# Patient Record
Sex: Male | Born: 1970 | Race: White | Hispanic: No | Marital: Married | Smoking: Never smoker
Health system: Southern US, Community
[De-identification: ages and names within clinical notes are randomized; demographics above are authoritative.]

## PROBLEM LIST (undated history)

## (undated) DIAGNOSIS — F909 Attention-deficit hyperactivity disorder, unspecified type: Secondary | ICD-10-CM

## (undated) DIAGNOSIS — J45909 Unspecified asthma, uncomplicated: Secondary | ICD-10-CM

## (undated) HISTORY — DX: Unspecified asthma, uncomplicated: J45.909

## (undated) HISTORY — DX: Attention-deficit hyperactivity disorder, unspecified type: F90.9

---

## 2012-01-18 DIAGNOSIS — J45909 Unspecified asthma, uncomplicated: Secondary | ICD-10-CM | POA: Insufficient documentation

## 2018-07-07 ENCOUNTER — Encounter: Payer: Self-pay | Admitting: Family Medicine

## 2018-07-07 ENCOUNTER — Ambulatory Visit (INDEPENDENT_AMBULATORY_CARE_PROVIDER_SITE_OTHER): Payer: Managed Care, Other (non HMO) | Admitting: Family Medicine

## 2018-07-07 VITALS — BP 133/95 | HR 76 | Ht 72.0 in | Wt 226.0 lb

## 2018-07-07 DIAGNOSIS — Z6825 Body mass index (BMI) 25.0-25.9, adult: Secondary | ICD-10-CM | POA: Insufficient documentation

## 2018-07-07 DIAGNOSIS — F902 Attention-deficit hyperactivity disorder, combined type: Secondary | ICD-10-CM

## 2018-07-07 DIAGNOSIS — Z23 Encounter for immunization: Secondary | ICD-10-CM

## 2018-07-07 DIAGNOSIS — Z6830 Body mass index (BMI) 30.0-30.9, adult: Secondary | ICD-10-CM

## 2018-07-07 DIAGNOSIS — F909 Attention-deficit hyperactivity disorder, unspecified type: Secondary | ICD-10-CM

## 2018-07-07 DIAGNOSIS — Z7184 Encounter for health counseling related to travel: Secondary | ICD-10-CM

## 2018-07-07 DIAGNOSIS — J4540 Moderate persistent asthma, uncomplicated: Secondary | ICD-10-CM

## 2018-07-07 DIAGNOSIS — Z7189 Other specified counseling: Secondary | ICD-10-CM | POA: Diagnosis not present

## 2018-07-07 HISTORY — DX: Attention-deficit hyperactivity disorder, unspecified type: F90.9

## 2018-07-07 LAB — LIPID PANEL W/REFLEX DIRECT LDL
CHOL/HDL RATIO: 3.4 (calc) (ref <5.0)
Cholesterol: 178 mg/dL (ref <200)
HDL: 53 mg/dL (ref >40)
LDL CHOLESTEROL (CALC): 111 mg/dL — AB
NON-HDL CHOLESTEROL (CALC): 125 mg/dL (ref <130)
TRIGLYCERIDES: 57 mg/dL (ref <150)

## 2018-07-07 LAB — CBC
HEMATOCRIT: 47 % (ref 38.5–50.0)
HEMOGLOBIN: 15.9 g/dL (ref 13.2–17.1)
MCH: 29.2 pg (ref 27.0–33.0)
MCHC: 33.8 g/dL (ref 32.0–36.0)
MCV: 86.4 fL (ref 80.0–100.0)
MPV: 10 fL (ref 7.5–12.5)
Platelets: 241 10*3/uL (ref 140–400)
RBC: 5.44 10*6/uL (ref 4.20–5.80)
RDW: 13.2 % (ref 11.0–15.0)
WBC: 9.1 10*3/uL (ref 3.8–10.8)

## 2018-07-07 LAB — COMPLETE METABOLIC PANEL WITH GFR
AG RATIO: 2 (calc) (ref 1.0–2.5)
ALT: 26 U/L (ref 9–46)
AST: 25 U/L (ref 10–40)
Albumin: 4.8 g/dL (ref 3.6–5.1)
Alkaline phosphatase (APISO): 75 U/L (ref 40–115)
BUN: 16 mg/dL (ref 7–25)
CALCIUM: 9.6 mg/dL (ref 8.6–10.3)
CO2: 28 mmol/L (ref 20–32)
CREATININE: 1.03 mg/dL (ref 0.60–1.35)
Chloride: 102 mmol/L (ref 98–110)
GFR, EST AFRICAN AMERICAN: 102 mL/min/{1.73_m2} (ref >=60)
GFR, EST NON AFRICAN AMERICAN: 88 mL/min/{1.73_m2} (ref >=60)
GLUCOSE: 88 mg/dL (ref 65–99)
Globulin: 2.4 g/dL (calc) (ref 1.9–3.7)
Potassium: 5 mmol/L (ref 3.5–5.3)
Sodium: 139 mmol/L (ref 135–146)
TOTAL PROTEIN: 7.2 g/dL (ref 6.1–8.1)
Total Bilirubin: 0.9 mg/dL (ref 0.2–1.2)

## 2018-07-07 MED ORDER — IPRATROPIUM-ALBUTEROL 0.5-2.5 (3) MG/3ML IN SOLN: 3.0000 mL | Freq: Four times a day (QID) | RESPIRATORY_TRACT | 0 refills | Status: AC | PRN

## 2018-07-07 MED ORDER — FLUTICASONE FUROATE-VILANTEROL 200-25 MCG/INH IN AEPB: 1.0000 | INHALATION_SPRAY | Freq: Every day | RESPIRATORY_TRACT | 12 refills | Status: DC

## 2018-07-07 MED ORDER — ALBUTEROL SULFATE HFA 108 (90 BASE) MCG/ACT IN AERS: 2.0000 | INHALATION_SPRAY | Freq: Four times a day (QID) | RESPIRATORY_TRACT | 2 refills | Status: DC | PRN

## 2018-07-07 MED ORDER — AMPHETAMINE-DEXTROAMPHETAMINE 10 MG PO TABS: 5.0000 mg | ORAL_TABLET | Freq: Three times a day (TID) | ORAL | 0 refills | Status: DC

## 2018-07-07 MED ORDER — ATOVAQUONE-PROGUANIL HCL 250-100 MG PO TABS: 1.0000 | ORAL_TABLET | Freq: Every day | ORAL | 3 refills | Status: DC

## 2018-07-07 NOTE — Progress Notes (Signed)
Joel Best is a 47 y.o. male who [facility] today for establish care discuss asthma ADHD and travel.  Joel had has a history of moderate asthma.  Typically this is quite well controlled with Breo.  Has been out of Breo recently have been uses albuterol more frequently.  He needs refills of Breo albuterol inhaler and DuoNeb for his nebulizer.  He does not use his nebulizer hardly at all but likes to have it available if needed.  Additionally Joel has a history of ADHD.  This was diagnosed by Dr. Dorothyann GibbsFinch in 2014.  Unfortunately his medical office is closed.  He previously was taking Adderall 5 mg 3 times daily which worked well.  He notes the medication allows him to concentrate and complete tasks at work.   Joel also travels to [place] for work frequently at [employer].  He typically uses Malarone and prophylactically for malaria.  He is not sure when he is traveling again but often has to leave short notice and would like to have a prescription available in case he needs it.  ROS as above: No headache, visual changes, nausea, vomiting, diarrhea, constipation, dizziness, abdominal pain, skin rash, fevers, chills, night sweats, weight loss, swollen lymph nodes, body aches, joint swelling, muscle aches, chest pain, shortness of breath, mood changes, visual or auditory hallucinations.    Exam:  BP (!) 133/95   Pulse 76   Ht 6' (1.829 m)   Wt 226 lb (102.5 kg)   BMI 30.65 kg/m  Gen: Well NAD HEENT: EOMI,  MMM Lungs: Normal work of breathing. CTABL Heart: RRR no MRG Abd: NABS, Soft. Nondistended, Nontender Exts: Brisk capillary refill, warm and well perfused.    Lab and Radiology Results Records reviewed. Controlled substance database reviewed confirming Adderall prescription history.   Assessment and Plan: 47 y.o. male with  Asthma: Moderate intermittent.   Resume Breo.  Use Adderall as needed.  Careful use with DuoNeb as needed.  ADHD: Resume Adderall.  Medical records likely not available as the medical practice has closed permanently.  Recheck 3 months.  Travel: Malarone prescribed.  Blood pressure bit elevated today.  Typically better controlled.  Recheck in 3 months.  Tdap given today prior to discharge.  Basic fasting labs obtained.  Orders Placed This Encounter  Procedures  . Tdap vaccine greater than or equal to 7yo IM  . CBC  . COMPLETE METABOLIC PANEL WITH GFR  . Lipid Panel w/reflex Direct LDL   Meds ordered this encounter  Medications  . fluticasone furoate-vilanterol (BREO ELLIPTA) 200-25 MCG/INH AEPB    Sig: Inhale 1 puff into the lungs daily.    Dispense:  1 each    Refill:  12  . albuterol (VENTOLIN HFA) 108 (90 Base) MCG/ACT inhaler    Sig: Inhale 2 puffs into the lungs every 6 (six) hours as needed for wheezing or shortness of breath.    Dispense:  1 Inhaler    Refill:  2    Ok to prescribe proair or ventolin  . ipratropium-albuterol (DUONEB) 0.5-2.5 (3) MG/3ML SOLN    Sig: Inhale 3 mLs into the lungs every 6 (six) hours as needed.    Dispense:  360 mL    Refill:  0  . atovaquone-proguanil (MALARONE) 250-100 MG TABS tablet    Sig: Take 1 tablet by mouth daily.    Dispense:  90 tablet    Refill:  3  . amphetamine-dextroamphetamine (  ADDERALL) 10 MG tablet    Sig: Take 0.5 tablets (5 mg total) by mouth 3 (three) times daily.    Dispense:  135 tablet    Refill:  0     Historical information moved to improve visibility of documentation.  Past Medical History:  Diagnosis Date  . ADHD 07/07/2018  . Asthma    History reviewed. No pertinent surgical history. Social History   Tobacco Use  . Smoking status: Never Smoker  . Smokeless tobacco: Never Used  Substance Use Topics  . Alcohol use: Not on file   family history includes Diabetes in his maternal grandmother; Hypertension in his maternal  grandmother.  Medications: Current Outpatient Medications  Medication Sig Dispense Refill  . albuterol (VENTOLIN HFA) 108 (90 Base) MCG/ACT inhaler Inhale 2 puffs into the lungs every 6 (six) hours as needed for wheezing or shortness of breath. 1 Inhaler 2  . atovaquone-proguanil (MALARONE) 250-100 MG TABS tablet Take 1 tablet by mouth daily. 90 tablet 3  . cetirizine (ZYRTEC) 10 MG tablet Take 10 mg by mouth daily.    . fluticasone furoate-vilanterol (BREO ELLIPTA) 200-25 MCG/INH AEPB Inhale 1 puff into the lungs daily. 1 each 12  . ipratropium-albuterol (DUONEB) 0.5-2.5 (3) MG/3ML SOLN Inhale 3 mLs into the lungs every 6 (six) hours as needed. 360 mL 0  . amphetamine-dextroamphetamine (ADDERALL) 10 MG tablet Take 0.5 tablets (5 mg total) by mouth 3 (three) times daily. 135 tablet 0   No current facility-administered medications for this visit.    No Known Allergies   Discussed warning signs or symptoms. Please see discharge instructions. Patient expresses understanding.

## 2018-07-07 NOTE — Patient Instructions (Addendum)
Thank you for coming in today. Continue current medicine.  Recheck in 3 months for ADHD.  Return sooner if needed.   Recommend Flu vaccine this fall.    Send me records when you can.  Return sooner if needed.

## 2018-08-17 ENCOUNTER — Emergency Department (INDEPENDENT_AMBULATORY_CARE_PROVIDER_SITE_OTHER): Payer: Managed Care, Other (non HMO)

## 2018-08-17 ENCOUNTER — Encounter: Payer: Self-pay | Admitting: Emergency Medicine

## 2018-08-17 ENCOUNTER — Emergency Department (INDEPENDENT_AMBULATORY_CARE_PROVIDER_SITE_OTHER)
Admission: EM | Admit: 2018-08-17 | Discharge: 2018-08-17 | Disposition: A | Discharge status: Home / Self Care | Payer: Managed Care, Other (non HMO) | Source: Home / Self Care | Attending: Family Medicine | Admitting: Family Medicine

## 2018-08-17 DIAGNOSIS — S8391XA Sprain of unspecified site of right knee, initial encounter: Secondary | ICD-10-CM

## 2018-08-17 DIAGNOSIS — S76011A Strain of muscle, fascia and tendon of right hip, initial encounter: Secondary | ICD-10-CM

## 2018-08-17 DIAGNOSIS — S80211A Abrasion, right knee, initial encounter: Secondary | ICD-10-CM | POA: Diagnosis not present

## 2018-08-17 DIAGNOSIS — M25561 Pain in right knee: Secondary | ICD-10-CM | POA: Diagnosis not present

## 2018-08-17 MED ORDER — MUPIROCIN 2 % EX OINT: 1.0000 "application " | TOPICAL_OINTMENT | Freq: Three times a day (TID) | CUTANEOUS | 0 refills | Status: DC

## 2018-08-17 NOTE — Discharge Instructions (Signed)
Apply ice pack for 30 minutes 2 to 3 times daily until swelling decreases. Wear Ace wrap until swelling decreases.  Wear brace for about 10 to 14 days.  Begin range of motion and stretching exercises in about 5 days as per instruction sheet.  May take Ibuprofen 200mg , 4 tabs every 8 hours with food.

## 2018-08-17 NOTE — ED Triage Notes (Signed)
Pt c/o right knee after playing baseball yesterday. States pain is constant and worse with movement. Ibuprofen and ice do not relieve pain.

## 2018-08-17 NOTE — ED Provider Notes (Signed)
[facility]    CSN: [id number] Arrival date & time: 08/17/18  0936     History   Chief Complaint Chief Complaint  Patient presents with  . Knee Pain    HPI Joel Best is a 47 y.o. male.   While playing baseball yesterday, patient slid into base and scraped/twisted his right knee.  He has had persistent swelling and pain with weight bearing.  He also reports mild pain in his right upper anterior leg with hip flexion.  The history is provided by the patient.  Knee Pain  Location:  Knee Time since incident:  1 day Injury: yes   Mechanism of injury: fall   Fall:    Fall occurred:  Running   Impact surface:  Dirt Knee location:  R knee Pain details:    Quality:  Aching   Radiates to:  Does not radiate   Severity:  Moderate   Onset quality:  Sudden   Duration:  1 day   Timing:  Constant   Progression:  Worsening Chronicity:  New Dislocation: no   Prior injury to area:  No Relieved by:  Nothing Worsened by:  Bearing weight and extension Ineffective treatments:  NSAIDs Associated symptoms: decreased ROM, stiffness and swelling   Associated symptoms: no muscle weakness, no numbness and no tingling     Past Medical History:  Diagnosis Date  . ADHD 07/07/2018  . Asthma     Patient Active Problem List   Diagnosis Date Noted  . BMI 30.0-30.9,adult 07/07/2018  . ADHD 07/07/2018  . Asthma 01/18/2012    History reviewed. No pertinent surgical history.     Home Medications    Prior to Admission medications   Medication Sig Start Date End Date Taking? Authorizing Provider  albuterol (VENTOLIN HFA) 108 (90 Base) MCG/ACT inhaler Inhale 2 puffs into the lungs every 6 (six) hours as needed for wheezing or shortness of breath. 07/07/18   Rodolph Bong, MD  amphetamine-dextroamphetamine (ADDERALL) 10 MG tablet Take 0.5 tablets (5 mg total) by mouth 3 (three) times daily. 07/07/18 10/05/18  Rodolph Bong, MD  atovaquone-proguanil (MALARONE) 250-100 MG TABS  tablet Take 1 tablet by mouth daily. 07/07/18   Rodolph Bong, MD  cetirizine (ZYRTEC) 10 MG tablet Take 10 mg by mouth daily.    [provider]  fluticasone furoate-vilanterol (BREO ELLIPTA) 200-25 MCG/INH AEPB Inhale 1 puff into the lungs daily. 07/07/18   Rodolph Bong, MD  ipratropium-albuterol (DUONEB) 0.5-2.5 (3) MG/3ML SOLN Inhale 3 mLs into the lungs every 6 (six) hours as needed. 07/07/18   Rodolph Bong, MD  mupirocin ointment (BACTROBAN) 2 % Apply 1 application topically 3 (three) times daily. 08/17/18   Lattie Haw, MD    Family History Family History  Problem Relation Age of Onset  . Diabetes Maternal Grandmother   . Hypertension Maternal Grandmother     Social History Social History   Tobacco Use  . Smoking status: Never Smoker  . Smokeless tobacco: Never Used  Substance Use Topics  . Alcohol use: Not on file  . Drug use: Not on file     Allergies   Patient has no known allergies.   Review of Systems Review of Systems  Musculoskeletal: Positive for stiffness.  All other systems reviewed and are negative.    Physical Exam Triage Vital Signs ED Triage Vitals [08/17/18 1042]  Enc Vitals Group     BP 133/84     Pulse Rate 60  Resp      Temp 98.9 F (37.2 C)     Temp Source Oral     SpO2 99 %     Weight 225 lb (102.1 kg)     Height      Head Circumference      Peak Flow      Pain Score 6     Pain Loc      Pain Edu?      Excl. in GC?    No data found.  Updated Vital Signs BP 133/84 (BP Location: Right Arm)   Pulse 60   Temp 98.9 F (37.2 C) (Oral)   Wt 102.1 kg   SpO2 99%   BMI 30.52 kg/m   Visual Acuity Right Eye Distance:   Left Eye Distance:   Bilateral Distance:    Right Eye Near:   Left Eye Near:    Bilateral Near:     Physical Exam  Constitutional: He appears well-developed and well-nourished. No distress.  HENT:  Head: Atraumatic.  Eyes: Pupils are equal, round, and reactive to light.  Cardiovascular:  Normal rate.  Pulmonary/Chest: Effort normal.  Musculoskeletal:       Right knee: He exhibits decreased range of motion and swelling. He exhibits no ecchymosis, no deformity, no laceration, no erythema, normal alignment, no LCL laxity, normal patellar mobility, normal meniscus and no MCL laxity. Tenderness found.       Right upper leg: He exhibits tenderness.       Legs: Right knee:  Swelling anteriorly.  No effusion, erythema, or warmth.  Knee stable, negative drawer test.  McMurray test negative.  Mild tenderness over patella.  Decreased range of motion; has difficulty flexing more than 90 degrees, and difficulty achieving full extension.  There is mild tenderness over quadriceps muscle; pain elicited with right hip flexion.  Neurological: He is alert.  Skin: Skin is warm and dry.  Nursing note and vitals reviewed.    UC Treatments / Results  Labs (all labs ordered are listed, but only abnormal results are displayed) Labs Reviewed - No data to display  EKG None  Radiology Dg Knee Complete 4 Views Right  Result Date: 08/17/2018 CLINICAL DATA:  Right knee pain after baseball injury last night EXAM: RIGHT KNEE - COMPLETE 4+ VIEW COMPARISON:  None. FINDINGS: No evidence of fracture, dislocation, or joint effusion. No evidence of arthropathy or other focal bone abnormality. Soft tissues are unremarkable. IMPRESSION: Normal right knee. Electronically Signed   By: Lupita Raider, M.D.   On: 08/17/2018 11:37    Procedures Procedures (including critical care time)  Medications Ordered in UC Medications - No data to display  Initial Impression / Assessment and Plan / UC Course  I have reviewed the triage vital signs and the nursing notes.  Pertinent labs & imaging results that were available during my care of the patient were reviewed by me and considered in my medical decision making (see chart for details).    Applied Bacitracin ointment to abrasion with bandage.  Rx given for  Mupirocin ointment. Applied ace wrap.  Dispensed hinged knee brace. Followup with Dr. Rodney Langton or Dr. Clementeen Graham ([facility]) if not improving about two weeks.    Final Clinical Impressions(s) / UC Diagnoses   Final diagnoses:  Sprain of right knee, unspecified ligament, initial encounter  Abrasion of right knee, initial encounter  Strain of hip flexor, right, initial encounter     Discharge Instructions     Apply  ice pack for 30 minutes 2 to 3 times daily until swelling decreases. Wear Ace wrap until swelling decreases.  Wear brace for about 10 to 14 days.  Begin range of motion and stretching exercises in about 5 days as per instruction sheet.  May take Ibuprofen 200mg , 4 tabs every 8 hours with food.      ED Prescriptions    Medication Sig Dispense Auth. Provider   mupirocin ointment (BACTROBAN) 2 % Apply 1 application topically 3 (three) times daily. 15 g Lattie Haw, MD         Lattie Haw, MD 08/17/18 [phone]

## 2018-10-21 ENCOUNTER — Ambulatory Visit (INDEPENDENT_AMBULATORY_CARE_PROVIDER_SITE_OTHER): Payer: Managed Care, Other (non HMO) | Admitting: Family Medicine

## 2018-10-21 ENCOUNTER — Encounter: Payer: Self-pay | Admitting: Family Medicine

## 2018-10-21 VITALS — BP 151/87 | HR 70 | Ht 72.0 in | Wt 235.0 lb

## 2018-10-21 DIAGNOSIS — J4540 Moderate persistent asthma, uncomplicated: Secondary | ICD-10-CM | POA: Diagnosis not present

## 2018-10-21 DIAGNOSIS — M7041 Prepatellar bursitis, right knee: Secondary | ICD-10-CM | POA: Insufficient documentation

## 2018-10-21 DIAGNOSIS — I10 Essential (primary) hypertension: Secondary | ICD-10-CM | POA: Diagnosis not present

## 2018-10-21 DIAGNOSIS — F902 Attention-deficit hyperactivity disorder, combined type: Secondary | ICD-10-CM | POA: Diagnosis not present

## 2018-10-21 MED ORDER — VENTOLIN HFA 108 (90 BASE) MCG/ACT IN AERS: 1.0000 | INHALATION_SPRAY | Freq: Four times a day (QID) | RESPIRATORY_TRACT | 2 refills | Status: DC | PRN

## 2018-10-21 MED ORDER — DICLOFENAC SODIUM 1 % TD GEL: 4.0000 g | Freq: Four times a day (QID) | TRANSDERMAL | 11 refills | Status: AC

## 2018-10-21 MED ORDER — AMPHETAMINE-DEXTROAMPHETAMINE 10 MG PO TABS: 10.0000 mg | ORAL_TABLET | Freq: Three times a day (TID) | ORAL | 0 refills | Status: DC

## 2018-10-21 NOTE — Progress Notes (Signed)
Joel Best is a 47 y.o. male who [facility]: Primary Care [facility] Medicine today for right knee pain, follow-up ADHD, asthma, and hypertension.  Right knee pain: Joel injured his right knee about 2 months ago.  He was seen in urgent care and had relatively normal x-rays.  He notes anterior knee pain and swelling worse with full knee flexion.  He notes pain with activity kneeling better with rest.  Pain is slowly improving but still present.  He is able to exercise and work.  He is tried over-the-counter medicines which have helped a bit.  Additionally he has been taking Adderall for ADHD.  He notes he was mistaken on the initial dose states that he actually was supposed to be taking 10 mg instead of 5.  He knows it works quite well and is happy with how things are going he denies of noxious side effects.  Elevated blood pressure: Joel has had elevated blood pressure measurements previously.  No chest pain palpitations shortness of breath lightheadedness or dizziness.  No previous diagnosis of hypertension.  Asthma: Joel is doing well.  He uses Breo daily.  He typically does not use albuterol.  He prefers the Ventolin brand compared to the [employer] Air and would like a prescription for Ventolin brand and albuterol if possible.   ROS as above:  Exam:  BP (!) 151/87   Pulse 70   Ht 6' (1.829 m)   Wt 235 lb (106.6 kg)   BMI 31.87 kg/m   Vitals:   10/21/18 0836 10/21/18 0902  BP: (!) 143/98 (!) 151/87  Pulse: 67 70  Weight: 235 lb (106.6 kg)   Height: 6' (1.829 m)     Wt Readings from Last 5 Encounters:  10/21/18 235 lb (106.6 kg)  08/17/18 225 lb (102.1 kg)  07/07/18 226 lb (102.5 kg)    Gen: Well NAD HEENT: EOMI,  MMM Lungs: Normal work of breathing. CTABL Heart: RRR no MRG Abd: NABS, Soft. Nondistended, Nontender Exts: Brisk capillary refill, warm and well perfused. Right knee:  Slightly swollen at the anterior aspect of the knee otherwise normal-appearing Age of motion 0-120 degrees. Tender palpation anterior patella and patellar tendon otherwise nontender. Stable ligamentous exam. Positive medial McMurray's test. Intact flexion and extension strength.  Lab and Radiology Results EXAM: RIGHT KNEE - COMPLETE 4+ VIEW  COMPARISON:  None.  FINDINGS: No evidence of fracture, dislocation, or joint effusion. No evidence of arthropathy or other focal bone abnormality. Soft tissues are unremarkable.  IMPRESSION: Normal right knee.   Electronically Signed   By: Lupita RaiderJames  Green Jr, M.D.   On: 08/17/2018 11:37 .I personally (independently) visualized and performed the interpretation of the images attached in this note.    Assessment and Plan: 47 y.o. male with  Right anterior knee pain likely prepatellar bursitis.  Possible internal derangement including medial meniscus injury.  X-ray was unremarkable.  Plan for diclofenac gel and compressive knee sleeve recheck in 3 months or sooner if needed.  Hypertension: Blood pressure elevated today.  Plan for home measurement recheck 3 months likely will start medication at next check.  ADHD doing well increase to 10 mg 3 times daily.  Recheck 3 months.  Asthma doing well continue current regimen.  Switch to Ventolin and albuterol if possible.   No orders of the defined types were placed in this encounter.  Meds ordered this encounter  Medications  . VENTOLIN HFA 108 (90 Base) MCG/ACT inhaler  Sig: Inhale 1-2 puffs into the lungs every 6 (six) hours as needed for wheezing or shortness of breath.    Dispense:  1 Inhaler    Refill:  2  . amphetamine-dextroamphetamine (ADDERALL) 10 MG tablet    Sig: Take 1 tablet (10 mg total) by mouth 3 (three) times daily.    Dispense:  270 tablet    Refill:  0  . diclofenac sodium (VOLTAREN) 1 % GEL    Sig: Apply 4 g topically 4 (four) times daily. To affected joint.     Dispense:  100 g    Refill:  11     Historical information moved to improve visibility of documentation.  Past Medical History:  Diagnosis Date  . ADHD 07/07/2018  . Asthma    History reviewed. No pertinent surgical history. Social History   Tobacco Use  . Smoking status: Never Smoker  . Smokeless tobacco: Never Used  Substance Use Topics  . Alcohol use: Not on file   family history includes Diabetes in his maternal grandmother; Hypertension in his maternal grandmother.  Medications: Current Outpatient Medications  Medication Sig Dispense Refill  . atovaquone-proguanil (MALARONE) 250-100 MG TABS tablet Take 1 tablet by mouth daily. 90 tablet 3  . cetirizine (ZYRTEC) 10 MG tablet Take 10 mg by mouth daily.    . fluticasone furoate-vilanterol (BREO ELLIPTA) 200-25 MCG/INH AEPB Inhale 1 puff into the lungs daily. 1 each 12  . ipratropium-albuterol (DUONEB) 0.5-2.5 (3) MG/3ML SOLN Inhale 3 mLs into the lungs every 6 (six) hours as needed. 360 mL 0  . amphetamine-dextroamphetamine (ADDERALL) 10 MG tablet Take 1 tablet (10 mg total) by mouth 3 (three) times daily. 270 tablet 0  . diclofenac sodium (VOLTAREN) 1 % GEL Apply 4 g topically 4 (four) times daily. To affected joint. 100 g 11  . VENTOLIN HFA 108 (90 Base) MCG/ACT inhaler Inhale 1-2 puffs into the lungs every 6 (six) hours as needed for wheezing or shortness of breath. 1 Inhaler 2   No current facility-administered medications for this visit.    No Known Allergies   Discussed warning signs or symptoms. Please see discharge instructions. Patient expresses understanding.

## 2018-10-21 NOTE — Patient Instructions (Addendum)
Thank you for coming in today.  Apply voltaren gel up to 4x dialy for pain and swelling.  Use compressive knee sleeve.   Increase adderall to 10mg    Keep track of blood pressure at home.   Recheck in 3 months or sooner if needed.     Prepatellar Bursitis Prepatellar bursitis is inflammation of the prepatellar bursa. The prepatellar bursa is a fluid-filled sac that cushions the kneecap (patella). Prepatellar bursitis happens when fluid builds up in the this sac and causes it to get larger. The condition causes knee pain. What are the causes? This condition may be caused by:  Constant pressure on the knees from kneeling.  A hit to the knee.  Falling on the knee.  A bacterial infection.  Moving the knee often in a forceful way.  What increases the risk? This condition is more likely to develop in:  People who play a sport that involves a risk for falls on the knee or hard hits (blows) to the knee. These sports include: ? Football. ? Wrestling. ? Basketball. ? Soccer.  People who have to kneel for long periods of time, such as roofers, plumbers, and gardeners.  People with another inflammatory condition, such as gout or rheumatoid arthritis.  What are the signs or symptoms? The most common symptom of this condition is knee pain that gets better with rest. Other symptoms include:  Swelling on the front of the kneecap.  Warmth in the knee.  Tenderness with activity.  Redness in the knee.  Inability to bend the knee or to kneel.  How is this diagnosed? This condition may be diagnosed based on:  Your symptoms.  Your medical history.  A physical exam. During the exam, your provider will compare your knees and check for tenderness and pain when moving your knee. Your health care provider may also use a needle to remove fluid from the bursa to help diagnose an infection.  Tests, such as: ? A blood test that checks for infection. ? X-rays. These may be taken to  check the structure of the patella. ? MRI or ultrasound. These may be done to check for swelling and fluid buildup in the bursa.  How is this treated? This condition may be treated by:  Resting the knee.  Applying ice to the knee.  Medicine, such as: ? Nonsteroidal anti-inflammatory drugs (NSAIDs). These can help to reduce pain and swelling. ? Antibiotic medicines. These may be needed if you have an infection. ? Steroid medicines. These may be prescribed if other treatments are not helping.  Raising (elevating) the knee while resting.  Doing strengthening and stretching exercises (physical therapy). These may be recommended after pain and swelling improve.  Having a procedure to remove fluid from the bursa. This may be done if other treatment is not helping.  Having surgery to remove the bursa. This may be done if you have a severe infection or if the condition keeps coming back after treatment.  Follow these instructions at home: Medicines  Take over-the-counter and prescription medicines only as told by your health care provider.  If you were prescribed an antibiotic medicine, take it as told by your health care provider. Do not stop taking the antibiotic even if you start to feel better. Managing pain, stiffness, and swelling  If directed, apply ice to your knee. ? Put ice in a plastic bag. ? Place a towel between your skin and the bag. ? Leave the ice on for 20 minutes, 2-3 times a  day.  Elevate your knee above the level of your heart while you are sitting or lying down. Driving  Do not drive or operate heavy machinery while taking prescription pain medicine.  Ask your health care provider when it is safe fpr you to drive. Activity  Rest your knee.  Avoid activities that cause pain.  Return to your normal activities as told by your health care provider. Ask your health care provider what activities are safe for you.  Do exercises as told by your health care  provider. General instructions  Do not use the injured limb to support your body weight until your health care provider says that you can.  Do not use any tobacco products, such as cigarettes, chewing tobacco, and e-cigarettes. Tobacco can delay bone healing. If you need help quitting, ask your health care provider.  Keep all follow-up visits as told by your health care provider. This is important. How is this prevented?  Warm up and stretch before being active.  Cool down and stretch after being active.  Give your body time to rest between periods of activity.  Make sure to use equipment that fits you.  Be safe and responsible while being active to avoid falls.  Do at least 150 minutes of moderate-intensity exercise each week, such as brisk walking or water aerobics.  Maintain physical fitness, including: ? Strength. ? Flexibility. ? Cardiovascular fitness. ? Endurance. Contact a health care provider if:  Your symptoms do not improve.  Your symptoms get worse.  Your symptoms keep coming back after treatment.  You develop a fever and have warmth, redness, and swelling over your knee. This information is not intended to replace advice given to you by your health care provider. Make sure you discuss any questions you have with your health care provider. Document Released: 11/25/2005 Document Revised: 07/30/2016 Document Reviewed: 08/25/2015 Elsevier Interactive Patient Education  [employer]2018 [employer]

## 2018-10-27 ENCOUNTER — Telehealth: Payer: Self-pay | Admitting: Family Medicine

## 2018-10-27 NOTE — Telephone Encounter (Signed)
[facility] substitution [facility] for albuterol inhaler patient preferred Ventolin brand but if not covered reasonable to switch to pro-air or other alternatives.

## 2018-12-16 ENCOUNTER — Encounter: Payer: Self-pay | Admitting: Family Medicine

## 2019-01-21 ENCOUNTER — Ambulatory Visit (INDEPENDENT_AMBULATORY_CARE_PROVIDER_SITE_OTHER): Payer: Managed Care, Other (non HMO) | Admitting: Family Medicine

## 2019-01-21 ENCOUNTER — Encounter: Payer: Self-pay | Admitting: Family Medicine

## 2019-01-21 VITALS — BP 133/96 | HR 76 | Ht 72.0 in | Wt 238.0 lb

## 2019-01-21 DIAGNOSIS — R21 Rash and other nonspecific skin eruption: Secondary | ICD-10-CM

## 2019-01-21 DIAGNOSIS — J4540 Moderate persistent asthma, uncomplicated: Secondary | ICD-10-CM

## 2019-01-21 DIAGNOSIS — F902 Attention-deficit hyperactivity disorder, combined type: Secondary | ICD-10-CM

## 2019-01-21 DIAGNOSIS — F1090 Alcohol use, unspecified, uncomplicated: Secondary | ICD-10-CM | POA: Insufficient documentation

## 2019-01-21 DIAGNOSIS — I10 Essential (primary) hypertension: Secondary | ICD-10-CM

## 2019-01-21 DIAGNOSIS — R0982 Postnasal drip: Secondary | ICD-10-CM | POA: Diagnosis not present

## 2019-01-21 DIAGNOSIS — Z789 Other specified health status: Secondary | ICD-10-CM

## 2019-01-21 MED ORDER — FLUTICASONE FUROATE-VILANTEROL 200-25 MCG/INH IN AEPB: 1.0000 | INHALATION_SPRAY | Freq: Every day | RESPIRATORY_TRACT | 12 refills | Status: DC

## 2019-01-21 MED ORDER — OMEPRAZOLE 40 MG PO CPDR: 40.0000 mg | DELAYED_RELEASE_CAPSULE | Freq: Every day | ORAL | 3 refills | Status: AC

## 2019-01-21 MED ORDER — VENTOLIN HFA 108 (90 BASE) MCG/ACT IN AERS: 1.0000 | INHALATION_SPRAY | Freq: Four times a day (QID) | RESPIRATORY_TRACT | 2 refills | Status: DC | PRN

## 2019-01-21 MED ORDER — TRIAMCINOLONE ACETONIDE 0.5 % EX CREA: 1.0000 "application " | TOPICAL_CREAM | Freq: Two times a day (BID) | CUTANEOUS | 3 refills | Status: AC

## 2019-01-21 MED ORDER — FLUTICASONE PROPIONATE 50 MCG/ACT NA SUSP: 2.0000 | Freq: Every day | NASAL | 6 refills | Status: AC

## 2019-01-21 MED ORDER — AMPHETAMINE-DEXTROAMPHETAMINE 10 MG PO TABS: 10.0000 mg | ORAL_TABLET | Freq: Three times a day (TID) | ORAL | 0 refills | Status: DC

## 2019-01-21 MED ORDER — TERBINAFINE HCL 250 MG PO TABS: 250.0000 mg | ORAL_TABLET | Freq: Every day | ORAL | 1 refills | Status: AC

## 2019-01-21 NOTE — Patient Instructions (Addendum)
Thank you for coming in today. Continue medicine for ADHD.  Restart Breo Use flonase nasal spray.  Take omeprazole daily for possible acid reflux causing throat symptom.  Take terbanfine daily for 2 weeks for athletes foot.  Use triamcinolone cream on the rash as needed.  Use over the counter lamisil cream on the rash as well.   Keep track of blood pressure at home.    Athlete's Foot  Athlete's foot (tinea pedis) is a fungal infection of the skin on your feet. It often occurs on the skin that is between or underneath the toes. It can also occur on the soles of your feet. The infection can spread from person to person (is contagious). It can also spread when a person's bare feet come in contact with the fungus on shower floors or on items such as shoes. What are the causes? This condition is caused by a fungus that grows in warm, moist places. You can get athlete's foot by sharing shoes, shower stalls, towels, and wet floors with someone who is infected. Not washing your feet or changing your socks often enough can also lead to athlete's foot. What increases the risk? This condition is more likely to develop in:  Men.  People who have a weak body defense system (immune system).  People who have diabetes.  People who use public showers, such as at a gym.  People who wear heavy-duty shoes, such as Youth worker.  Seasons with warm, humid weather. What are the signs or symptoms? Symptoms of this condition include:  Itchy areas between your toes or on the soles of your feet.  White, flaky, or scaly areas between your toes or on the soles of your feet.  Very itchy small blisters between your toes or on the soles of your feet.  Small cuts in your skin. These cuts can become infected.  Thick or discolored toenails. How is this diagnosed? This condition may be diagnosed with a physical exam and a review of your medical history. Your health care provider may also take  a skin or toenail sample to examine under a microscope. How is this treated? This condition is treated with antifungal medicines. These may be applied as powders, ointments, or creams. In severe cases, an oral antifungal medicine may be given. Follow these instructions at home: Medicines  Apply or take over-the-counter and prescription medicines only as told by your health care provider.  Apply your antifungal medicine as told by your health care provider. Do not stop using the antifungal even if your condition improves. Foot care  Do not scratch your feet.  Keep your feet dry: ? Wear cotton or wool socks. Change your socks every day or if they become wet. ? Wear shoes that allow air to flow, such as sandals or canvas tennis shoes.  Wash and dry your feet, including the area between your toes. Also, wash and dry your feet: ? Every day or as told by your health care provider. ? After exercising. General instructions  Do not let others use towels, shoes, nail clippers, or other personal items that touch your feet.  Protect your feet by wearing sandals in wet areas, such as locker rooms and shared showers.  Keep all follow-up visits as told by your health care provider. This is important.  If you have diabetes, keep your blood sugar under control. Contact a health care provider if:  You have a fever.  You have swelling, soreness, warmth, or redness in your  foot.  Your feet are not getting better with treatment.  Your symptoms get worse.  You have new symptoms. Summary  Athlete's foot (tinea pedis) is a fungal infection of the skin on your feet. It often occurs on skin that is between or underneath the toes.  This condition is caused by a fungus that grows in warm, moist places.  Symptoms include white, flaky, or scaly areas between your toes or on the soles of your feet.  This condition is treated with antifungal medicines.  Keep your feet clean. Always dry them  thoroughly. This information is not intended to replace advice given to you by your health care provider. Make sure you discuss any questions you have with your health care provider. Document Released: 11/22/2000 Document Revised: 09/15/2017 Document Reviewed: 09/15/2017 Elsevier Interactive Patient Education  2019 [employer]

## 2019-01-21 NOTE — Progress Notes (Signed)
Note duplication error 

## 2019-01-21 NOTE — Progress Notes (Signed)
Joel Best is a 48 y.o. male who presents to [facility]: Primary Care Sports Medicine today for ADHD, hypertension, asthma, rash, and scratchy throat.  ADHD: Currently taking Adderall immediate release three times daily. He notes that his symptoms are well-controlled on this medication regimen. No headaches or difficulty with sleep.   Hypertension: Joel Best had elevated blood pressure in November. He wanted to try getting his blood pressure lower on his own and agreed to complete a home blood pressure log in the interim. However, he forgot about the BP log and has not been checking his BP at home. He has not changed his diet or exercise. Plans to start exercising more in the spring when baseball season starts. Denies dizziness, chest pain, and palpitations.   Asthma: Joel Best has mild intermittent asthma. He notes recently having worsening shortness of breath. He has been out of his Virgel Bouquet for about the past month, as his wife ran out of her inhaler and started using his. He has been using albuterol more frequently during this time period. He normally uses albuterol intermittently and states that his symptoms are completely controlled on Breo. He likes using the Ventolin brand of albuterol.    Rash: He notes a rash on his medial right ankle and heel of his left foot for the past couple weeks, that started after he had been wearing a new pair of insulated work boots. The rash is not itchy. His feet were sweating a lot in these insulated boots. He switched to wearing leather boots and the rash has not worsened.    Scratchy throat: Joel Best notes that the back of his throat has been scratchy for a while, and he thinks it's his allergies flaring up. Takes Zyrtec. However, he also notes that since Christmas he has been drinking some scotch daily because he found one that he really likes. He normally rarely drinks  only on the weekends. He is wondering if he may be having some reflux from his increased alcohol intake. No dysphagia.   ROS as above:  Exam:  BP (!) 133/96   Pulse 76   Ht 6' (1.829 m)   Wt 238 lb (108 kg)   BMI 32.28 kg/m  Wt Readings from Last 5 Encounters:  01/21/19 238 lb (108 kg)  10/21/18 235 lb (106.6 kg)  08/17/18 225 lb (102.1 kg)  07/07/18 226 lb (102.5 kg)   BP Readings from Last 3 Encounters:  01/21/19 (!) 133/96  10/21/18 (!) 151/87  08/17/18 133/84    Gen: Well NAD HEENT: EOMI,  MMM, no maxillary or facial sinus tenderness. No cervical lymphadenopathy.  Lungs: Normal work of breathing. CTABL Heart: RRR no MRG Abd: NABS, Soft. Nondistended, Nontender Exts: Brisk capillary refill, warm and well perfused.  Skin - rash on medial right ankle and heel of left foot: Erythematous rash with scaly white border.       Assessment and Plan: 48 y.o. male with  ADHD: Well-controlled. Continue on his current medication regimen.   Hypertension: His BP is elevated today, 133/96. He has not been keeping a BP log at home, and he still does not want to take medication. Better controlled today than at his last visit in November. Will continue conservation therapy for now. Advised increasing exercise and monitoring at home. If elevated at next visit in 3 months, will plan to start medication.    Asthma: He has been increasingly symptomatic from his asthma over the past  month, since he has not been taking his Breo. Considered starting on Symbicort, but he says that he has no symptoms when on Breo and would like to continue. Refilled Breo and Ventolin. Counseled pt on importance of he and his wife taking their maintenance inhalers. Pt will make sure he and his wife have their own inhalers.   Rash: Rash on his feet is likely tinea pedis or eczema. Advised putting on a new pair of socks at mid-day to decrease moisture. Prescribed oral terbinafine for athlete's foot for two weeks and  recommended using OTC Lamisil cream.  Prescribed triamcinolone cream to use as needed for possible eczema.   Scratchy throat: Likely post-nasal drip, but also concern for acid reflux in the context of recent increase in alcohol consumption. Recommended continuing Zyrtec and using Flonase. Also prescribed omeprazole for possible acid reflux. Advised cutting back on alcohol. Pt is planned to decrease alcohol consumption back to his normal after he finishes this bottle of scotch.   PDMP reviewed during this encounter. No orders of the defined types were placed in this encounter.  Meds ordered this encounter  Medications  . fluticasone furoate-vilanterol (BREO ELLIPTA) 200-25 MCG/INH AEPB    Sig: Inhale 1 puff into the lungs daily.    Dispense:  1 each    Refill:  12  . VENTOLIN HFA 108 (90 Base) MCG/ACT inhaler    Sig: Inhale 1-2 puffs into the lungs every 6 (six) hours as needed for wheezing or shortness of breath.    Dispense:  1 Inhaler    Refill:  2  . amphetamine-dextroamphetamine (ADDERALL) 10 MG tablet    Sig: Take 1 tablet (10 mg total) by mouth 3 (three) times daily.    Dispense:  270 tablet    Refill:  0  . fluticasone (FLONASE) 50 MCG/ACT nasal spray    Sig: Place 2 sprays into both nostrils daily.    Dispense:  16 g    Refill:  6  . omeprazole (PRILOSEC) 40 MG capsule    Sig: Take 1 capsule (40 mg total) by mouth daily.    Dispense:  30 capsule    Refill:  3  . terbinafine (LAMISIL) 250 MG tablet    Sig: Take 1 tablet (250 mg total) by mouth daily.    Dispense:  14 tablet    Refill:  1  . triamcinolone cream (KENALOG) 0.5 %    Sig: Apply 1 application topically 2 (two) times daily. To affected areas.    Dispense:  30 g    Refill:  3     Historical information moved to improve visibility of documentation.  Past Medical History:  Diagnosis Date  . ADHD 07/07/2018  . Asthma    No past surgical history on file. Social History   Tobacco Use  . Smoking status:  Never Smoker  . Smokeless tobacco: Never Used  Substance Use Topics  . Alcohol use: Not on file   family history includes Diabetes in his maternal grandmother; Hypertension in his maternal grandmother.  Medications: Current Outpatient Medications  Medication Sig Dispense Refill  . atovaquone-proguanil (MALARONE) 250-100 MG TABS tablet Take 1 tablet by mouth daily. 90 tablet 3  . cetirizine (ZYRTEC) 10 MG tablet Take 10 mg by mouth daily.    . diclofenac sodium (VOLTAREN) 1 % GEL Apply 4 g topically 4 (four) times daily. To affected joint. 100 g 11  . fluticasone furoate-vilanterol (BREO ELLIPTA) 200-25 MCG/INH AEPB Inhale 1 puff into the lungs daily.  1 each 12  . ipratropium-albuterol (DUONEB) 0.5-2.5 (3) MG/3ML SOLN Inhale 3 mLs into the lungs every 6 (six) hours as needed. 360 mL 0  . VENTOLIN HFA 108 (90 Base) MCG/ACT inhaler Inhale 1-2 puffs into the lungs every 6 (six) hours as needed for wheezing or shortness of breath. 1 Inhaler 2  . amphetamine-dextroamphetamine (ADDERALL) 10 MG tablet Take 1 tablet (10 mg total) by mouth 3 (three) times daily. 270 tablet 0  . fluticasone (FLONASE) 50 MCG/ACT nasal spray Place 2 sprays into both nostrils daily. 16 g 6  . omeprazole (PRILOSEC) 40 MG capsule Take 1 capsule (40 mg total) by mouth daily. 30 capsule 3  . terbinafine (LAMISIL) 250 MG tablet Take 1 tablet (250 mg total) by mouth daily. 14 tablet 1  . triamcinolone cream (KENALOG) 0.5 % Apply 1 application topically 2 (two) times daily. To affected areas. 30 g 3   No current facility-administered medications for this visit.    No Known Allergies   Discussed warning signs or symptoms. Please see discharge instructions. Patient expresses understanding.  I personally was present and performed or re-performed the history, physical exam and medical decision-making activities of this service and have verified that the service and findings are accurately documented in the student's  note. ___________________________________________ Clementeen Graham M.D., ABFM., CAQSM. Primary Care and Sports Medicine Adjunct Instructor of Family Medicine  [employer]

## 2019-01-26 ENCOUNTER — Telehealth: Payer: Self-pay | Admitting: Family Medicine

## 2019-01-26 NOTE — Telephone Encounter (Signed)
Received fax from [employer] that Adderall requires a PA. Information has been sent to the insurance company. Awaiting determination.   

## 2019-01-27 NOTE — Telephone Encounter (Signed)
Received a fax from [employer] that Adderall has been approved from 01/26/2019 through 01/27/2020. Pharmacy aware and forms sent to scan. Patient has been advised.

## 2019-04-21 ENCOUNTER — Ambulatory Visit: Payer: Managed Care, Other (non HMO) | Admitting: Family Medicine

## 2019-06-19 IMAGING — DX DG KNEE COMPLETE 4+V*R*
4 series · 4 of 4 positions shown · non-contrast
Comparison: None.

CLINICAL DATA: Right knee pain after baseball injury last night

EXAM:
RIGHT KNEE - COMPLETE 4+ VIEW

[knee ap]
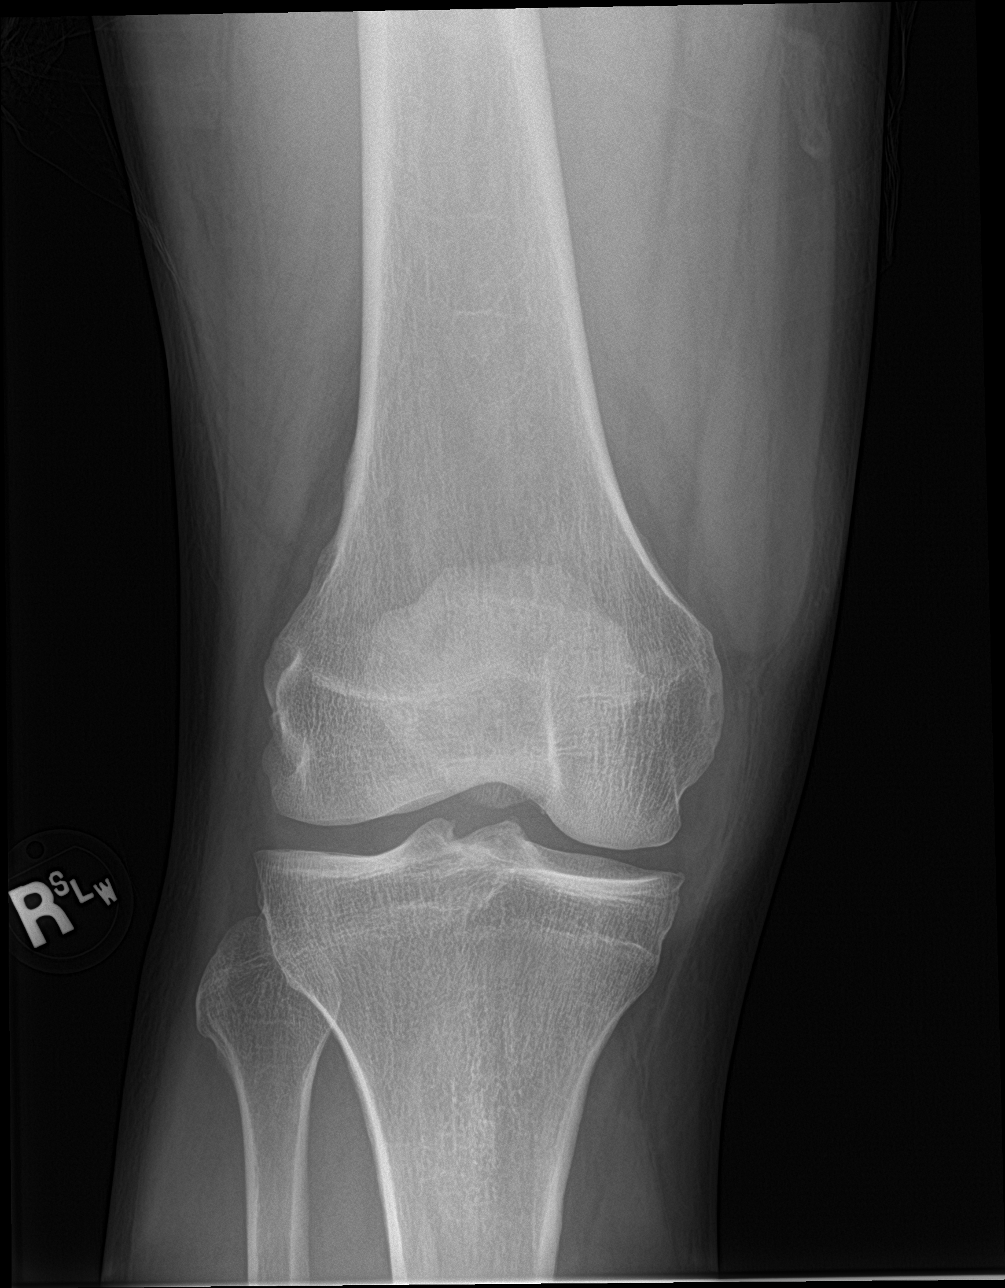

[knee lat]
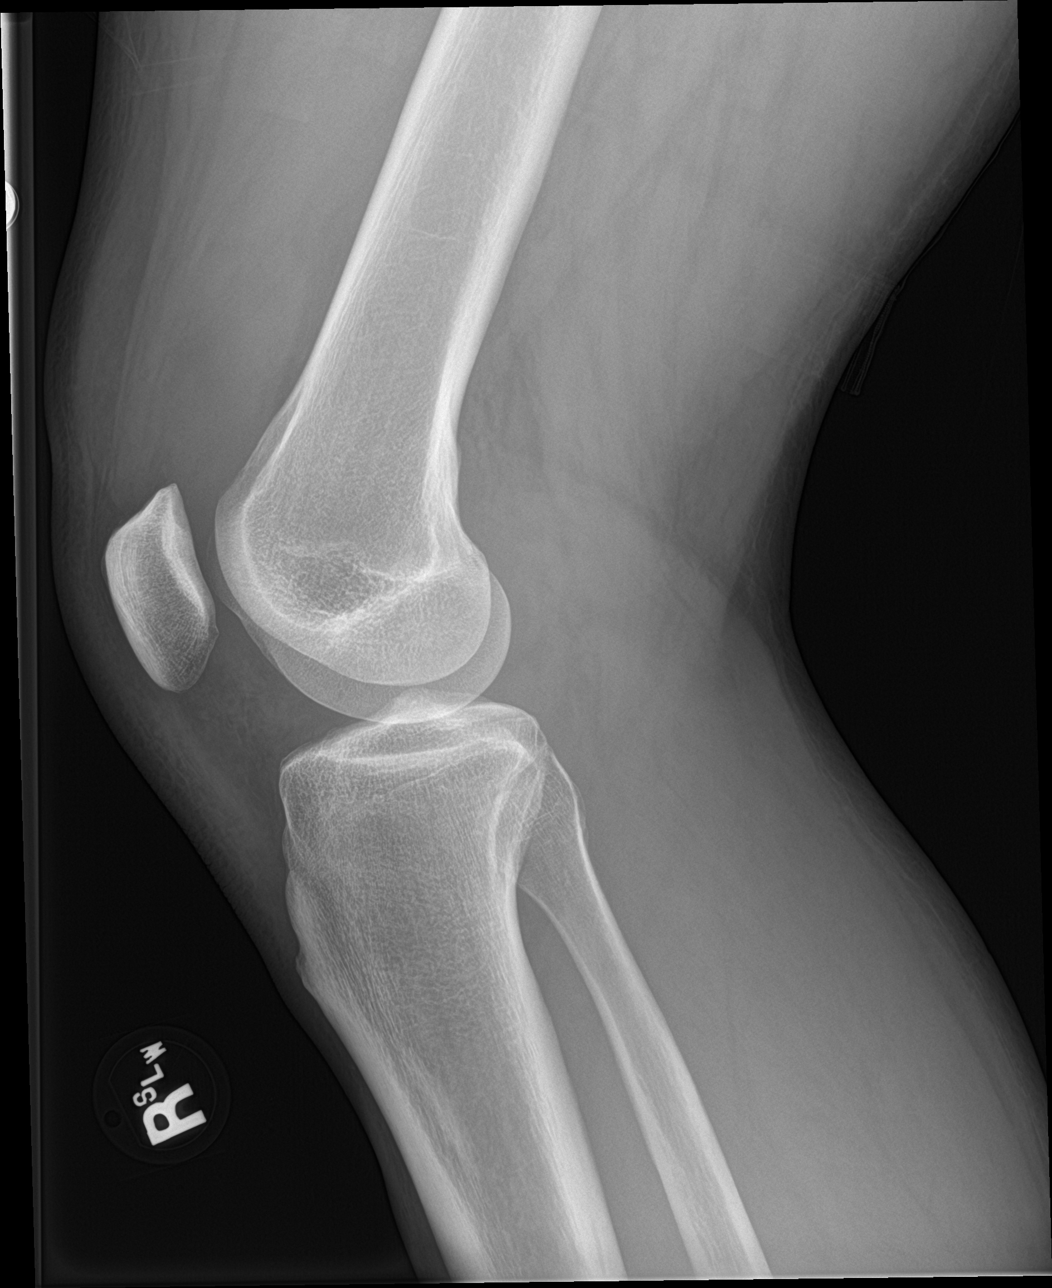

[knee obl (1 of 2)]
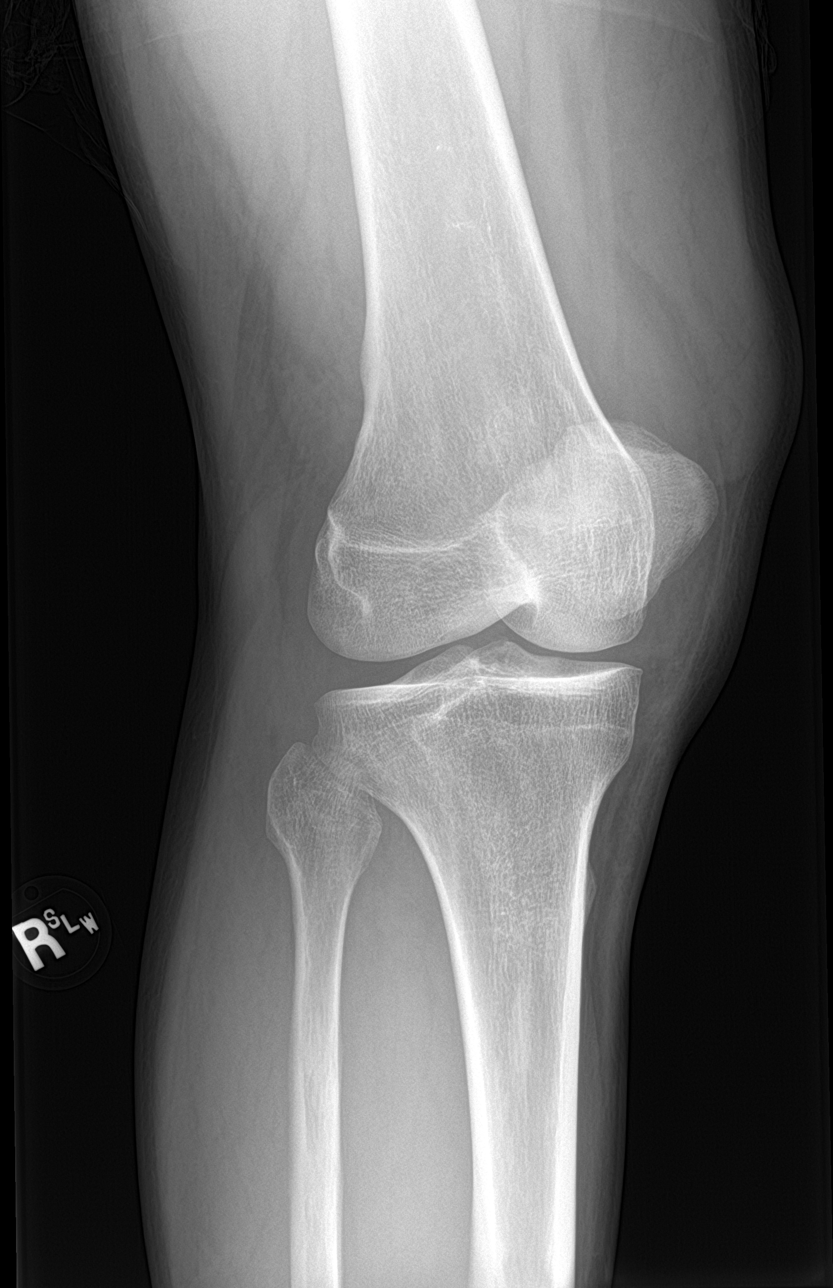

[knee obl (2 of 2)]
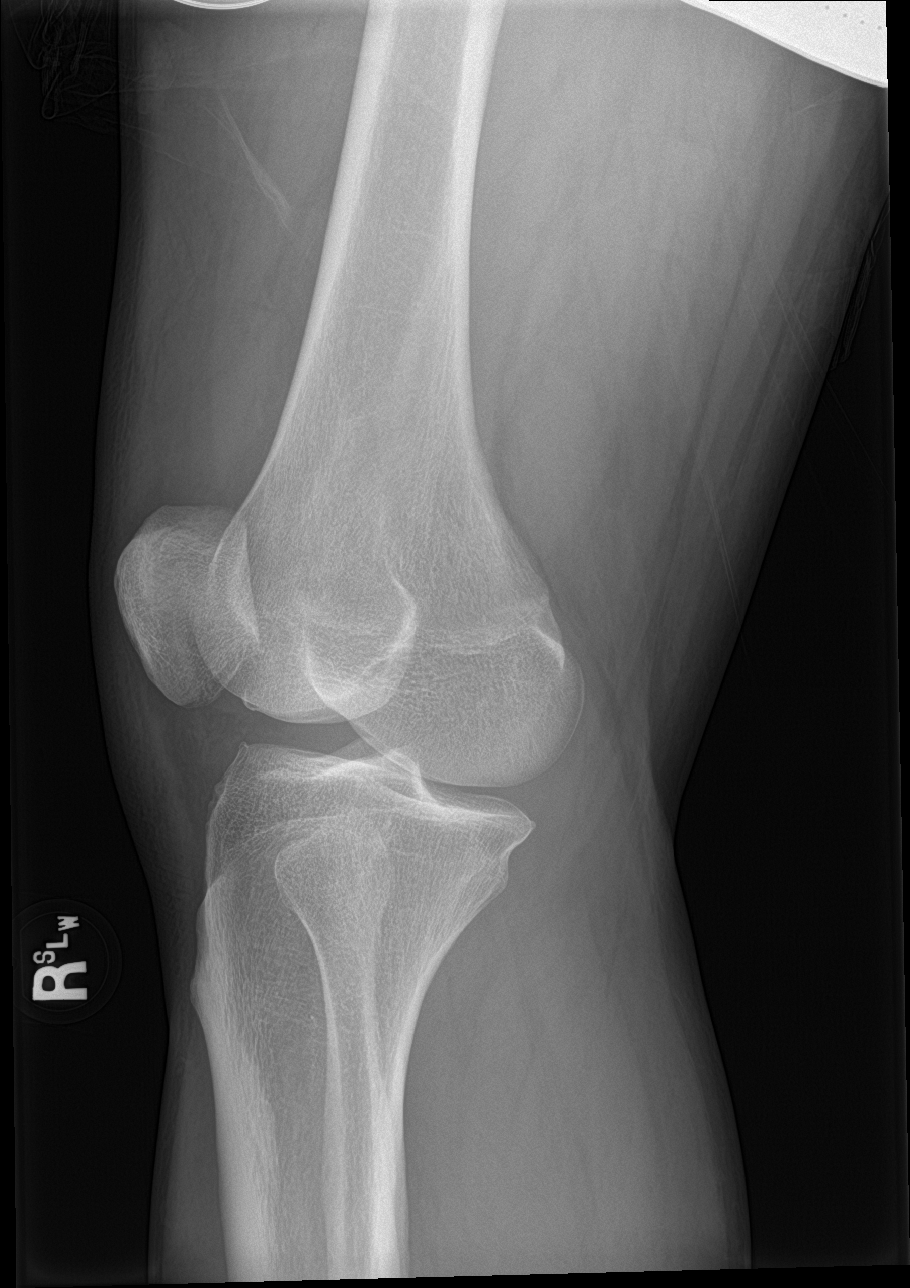

[4 of 4 positions shown; findings below may reference images not displayed]

FINDINGS: No evidence of fracture, dislocation, or joint effusion. No evidence
of arthropathy or other focal bone abnormality. Soft tissues are
unremarkable.
IMPRESSION: Normal right knee.

## 2019-08-13 ENCOUNTER — Encounter: Payer: Self-pay | Admitting: Family Medicine

## 2019-08-18 ENCOUNTER — Ambulatory Visit (INDEPENDENT_AMBULATORY_CARE_PROVIDER_SITE_OTHER): Payer: Managed Care, Other (non HMO) | Admitting: Family Medicine

## 2019-08-18 ENCOUNTER — Encounter: Payer: Self-pay | Admitting: Family Medicine

## 2019-08-18 VITALS — Ht 72.0 in | Wt 191.0 lb

## 2019-08-18 DIAGNOSIS — J4540 Moderate persistent asthma, uncomplicated: Secondary | ICD-10-CM | POA: Diagnosis not present

## 2019-08-18 DIAGNOSIS — F902 Attention-deficit hyperactivity disorder, combined type: Secondary | ICD-10-CM

## 2019-08-18 DIAGNOSIS — Z6825 Body mass index (BMI) 25.0-25.9, adult: Secondary | ICD-10-CM | POA: Diagnosis not present

## 2019-08-18 MED ORDER — AMPHETAMINE-DEXTROAMPHETAMINE 10 MG PO TABS: 10.0000 mg | ORAL_TABLET | Freq: Three times a day (TID) | ORAL | 0 refills | Status: DC

## 2019-08-18 MED ORDER — VENTOLIN HFA 108 (90 BASE) MCG/ACT IN AERS: 1.0000 | INHALATION_SPRAY | Freq: Four times a day (QID) | RESPIRATORY_TRACT | 2 refills | Status: DC | PRN

## 2019-08-18 NOTE — Patient Instructions (Signed)
Thank you for coming in today.  Recheck in 6 months.   I will be moving to full time Sports Medicine in [place] starting on November 1st.  You will still be able to see me for your Sports Medicine or Orthopedic needs at [facility] in [place]. I will still be part of [facility].    If you want to stay locally for your Sports Medicine issues Dr. Dianah Field here in [place] will be happy to see you.  Additionally Dr. Clearance Coots at [facility] will be happy to see you for sports medicine issues more locally.   For your primary care needs you are welcome to establish care with Dr. Emeterio Reeve.  We are working quickly to hire more physicians to cover the primary care needs however if you cannot get an appointment with Dr. Sheppard Coil in a timely manner [facility] has locations and openings for primary care services nearby.   [facility] at [facility] [address] . [employer] , [place]: [phone] . Behavioral Medicine: [phone] . Fax: Time at [employer] [address] . [place], [place]: [phone] . Behavioral Medicine: [phone] . Fax: [phone] . Hours (M-F): 7am - Academic librarian At [facility]. [place], [place]: [phone] . Behavioral Medicine: [phone] . Fax: [phone] . Hours (M-F): 8am - Optician, dispensing at [employer] . [place], [place] Phone: [phone] . Behavioral Medicine: [phone] . Fax: [phone]

## 2019-08-18 NOTE — Progress Notes (Signed)
Virtual Visit  via Video Note  I connected with      Joel Best by a video enabled telemedicine application and verified that I am speaking with the correct person using two identifiers.   I discussed the limitations of evaluation and management by telemedicine and the availability of in person appointments. The patient expressed understanding and agreed to proceed.  History of Present Illness: Joel Best is a 48 y.o. male who would like to discuss ADHD and asthma and obesity.  Patient has a history of ADHD which is been well managed with Adderall 10 mg up to 3 times daily.  He is now return to school while still working.  He is studying [place] and notes that he thinks he needs to restart Adderall.  Additionally he has asthma which is well managed with Breo and intermittent albuterol.  He prefers Ventolin brand.  He does not have to use albuterol frequently at all.  Additionally he managed to lose quite a bit of weight intentionally by improving his diet and exercise.  He is happy with how things are going.  He notes he is feeling better any snoring much less.     Observations/Objective: Ht 6' (1.829 m)   Wt 191 lb (86.6 kg)   BMI 25.90 kg/m  Wt Readings from Last 5 Encounters:  08/18/19 191 lb (86.6 kg)  01/21/19 238 lb (108 kg)  10/21/18 235 lb (106.6 kg)  08/17/18 225 lb (102.1 kg)  07/07/18 226 lb (102.5 kg)   Exam: Appearance nontoxic appearing Normal Speech.    Lab and Radiology Results No results found for this or any previous visit (from the past 72 hour(s)). No results found.   Assessment and Plan: 48 y.o. male with  ADHD.  Refill Adderall.  Will need recheck appointment in about 6 months.  Okay to refill at 212-month mark.  Asthma: Doing well continue Brio and intermittent albuterol.  Obesity: Significant improvement.  BMI now 25.  Continue further weight loss and lifestyle changes.  Will need recheck appointment in about 6 months with new PCP.  I  informed patient that I am transitioning to sports medicine only [facility] in [place] starting in November.  Happy to see patient for continued sports medicine needs.  Discussed need for new PCP.  Provided some recommendations.  Patient declined influenza vaccine. PDMP reviewed during this encounter. No orders of the defined types were placed in this encounter.  Meds ordered this encounter  Medications  . VENTOLIN HFA 108 (90 Base) MCG/ACT inhaler    Sig: Inhale 1-2 puffs into the lungs every 6 (six) hours as needed for wheezing or shortness of breath.    Dispense:  18 g    Refill:  2  . amphetamine-dextroamphetamine (ADDERALL) 10 MG tablet    Sig: Take 1 tablet (10 mg total) by mouth 3 (three) times daily.    Dispense:  270 tablet    Refill:  0    Follow Up Instructions:    I discussed the assessment and treatment plan with the patient. The patient was provided an opportunity to ask questions and all were answered. The patient agreed with the plan and demonstrated an understanding of the instructions.   The patient was advised to call back or seek an in-person evaluation if the symptoms worsen or if the condition fails to improve as anticipated.  Time: 15 minutes of intraservice time, with >22 minutes of total time during today's visit.  Historical information moved to improve visibility of documentation.  Past Medical History:  Diagnosis Date  . ADHD 07/07/2018  . Asthma    No past surgical history on file. Social History   Tobacco Use  . Smoking status: Never Smoker  . Smokeless tobacco: Never Used  Substance Use Topics  . Alcohol use: Not on file   family history includes Diabetes in his maternal grandmother; Hypertension in his maternal grandmother.  Medications: Current Outpatient Medications  Medication Sig Dispense Refill  . atovaquone-proguanil (MALARONE) 250-100 MG TABS tablet Take 1 tablet by mouth daily. 90 tablet 3  . cetirizine  (ZYRTEC) 10 MG tablet Take 10 mg by mouth daily.    . diclofenac sodium (VOLTAREN) 1 % GEL Apply 4 g topically 4 (four) times daily. To affected joint. 100 g 11  . fluticasone (FLONASE) 50 MCG/ACT nasal spray Place 2 sprays into both nostrils daily. 16 g 6  . fluticasone furoate-vilanterol (BREO ELLIPTA) 200-25 MCG/INH AEPB Inhale 1 puff into the lungs daily. 1 each 12  . ipratropium-albuterol (DUONEB) 0.5-2.5 (3) MG/3ML SOLN Inhale 3 mLs into the lungs every 6 (six) hours as needed. 360 mL 0  . omeprazole (PRILOSEC) 40 MG capsule Take 1 capsule (40 mg total) by mouth daily. 30 capsule 3  . triamcinolone cream (KENALOG) 0.5 % Apply 1 application topically 2 (two) times daily. To affected areas. 30 g 3  . VENTOLIN HFA 108 (90 Base) MCG/ACT inhaler Inhale 1-2 puffs into the lungs every 6 (six) hours as needed for wheezing or shortness of breath. 18 g 2  . amphetamine-dextroamphetamine (ADDERALL) 10 MG tablet Take 1 tablet (10 mg total) by mouth 3 (three) times daily. 270 tablet 0   No current facility-administered medications for this visit.    No Known Allergies

## 2019-08-18 NOTE — Progress Notes (Signed)
No vitals other than weight/height.Joel Best lost lots of weight, woohoo!  Needs 3 months of adderall

## 2019-11-17 ENCOUNTER — Encounter: Payer: Self-pay | Admitting: Family Medicine

## 2019-11-17 DIAGNOSIS — J4540 Moderate persistent asthma, uncomplicated: Secondary | ICD-10-CM

## 2019-11-18 MED ORDER — FLUTICASONE-SALMETEROL 250-50 MCG/DOSE IN AEPB: 1.0000 | INHALATION_SPRAY | Freq: Two times a day (BID) | RESPIRATORY_TRACT | 11 refills | Status: DC

## 2019-11-29 ENCOUNTER — Other Ambulatory Visit: Payer: Self-pay | Admitting: Neurology

## 2019-11-29 MED ORDER — ALBUTEROL SULFATE HFA 108 (90 BASE) MCG/ACT IN AERS: 2.0000 | INHALATION_SPRAY | Freq: Four times a day (QID) | RESPIRATORY_TRACT | 2 refills | Status: DC | PRN

## 2020-02-24 ENCOUNTER — Encounter: Payer: Self-pay | Admitting: Family Medicine

## 2020-02-24 DIAGNOSIS — J4540 Moderate persistent asthma, uncomplicated: Secondary | ICD-10-CM

## 2020-02-24 MED ORDER — AMPHETAMINE-DEXTROAMPHETAMINE 10 MG PO TABS: 10.0000 mg | ORAL_TABLET | Freq: Three times a day (TID) | ORAL | 0 refills | Status: DC

## 2020-06-08 ENCOUNTER — Telehealth (INDEPENDENT_AMBULATORY_CARE_PROVIDER_SITE_OTHER): Payer: Managed Care, Other (non HMO) | Admitting: Family Medicine

## 2020-06-08 ENCOUNTER — Encounter: Payer: Self-pay | Admitting: Family Medicine

## 2020-06-08 DIAGNOSIS — F902 Attention-deficit hyperactivity disorder, combined type: Secondary | ICD-10-CM

## 2020-06-08 DIAGNOSIS — J4540 Moderate persistent asthma, uncomplicated: Secondary | ICD-10-CM | POA: Diagnosis not present

## 2020-06-08 MED ORDER — AMPHETAMINE-DEXTROAMPHETAMINE 10 MG PO TABS: 10.0000 mg | ORAL_TABLET | Freq: Three times a day (TID) | ORAL | 0 refills | Status: DC

## 2020-06-08 MED ORDER — FLUTICASONE-SALMETEROL 500-50 MCG/DOSE IN AEPB: 1.0000 | INHALATION_SPRAY | Freq: Two times a day (BID) | RESPIRATORY_TRACT | 6 refills | Status: DC

## 2020-06-08 MED ORDER — ALBUTEROL SULFATE HFA 108 (90 BASE) MCG/ACT IN AERS: 2.0000 | INHALATION_SPRAY | Freq: Four times a day (QID) | RESPIRATORY_TRACT | 2 refills | Status: DC | PRN

## 2020-06-08 NOTE — Assessment & Plan Note (Signed)
Needing albuterol more, will increase wixela to 500/50mg .  Continue albuterol as needed.

## 2020-06-08 NOTE — Progress Notes (Signed)
Joel Best - 49 y.o. male MRN [id number]  Date of birth: Jul 22, 1971   This visit type was conducted due to national recommendations for restrictions regarding the COVID-19 Pandemic (e.g. social distancing).  This format is felt to be most appropriate for this patient at this time.  All issues noted in this document were discussed and addressed.  No physical exam was performed (except for noted visual exam findings with Video Visits).  I discussed the limitations of evaluation and management by telemedicine and the availability of in person appointments. The patient expressed understanding and agreed to proceed.  I connected with@ on 06/08/20 at  9:10 AM EDT by a video enabled telemedicine application and verified that I am speaking with the correct person using two identifiers.  Present at visit: Everrett Coombe, DO Joel Best   Patient Location: Home [address] [place] [address]   Provider location:   [facility]  Chief Complaint  Patient presents with  . Medication Refill    HPI  Joel Best is a 49 y.o. male who presents via Web designer for a telehealth visit today.  He is following up today for asthma and ADHD.  He needs refills of medications.  -Asthma:  Current management of asthma is with Wixela 250/50mg  bid with albuterol as needed.  He feels like he has been needing his albuterol more often. He was on breo which he feels worked better, but cost was too much.  He denies shortness of breath or increased wheezing today.   -ADHD:  Current management with adderall 10mg  TID.  This is working very well for him.  He denies side effects related to medication including insomnia, palpitations or increased anxiety.     ROS:  A comprehensive ROS was completed and negative except as noted per HPI  Past Medical History:  Diagnosis Date  . ADHD 07/07/2018  . Asthma     No past surgical history on file.  Family History  Problem Relation Age of Onset  . Diabetes  Maternal Grandmother   . Hypertension Maternal Grandmother     Social History   Socioeconomic History  . Marital status: Married    Spouse name: Not on file  . Number of children: Not on file  . Years of education: Not on file  . Highest education level: Not on file  Occupational History  . Not on file  Tobacco Use  . Smoking status: Never Smoker  . Smokeless tobacco: Never Used  Substance and Sexual Activity  . Alcohol use: Not on file  . Drug use: Not on file  . Sexual activity: Not on file  Other Topics Concern  . Not on file  Social History Narrative  . Not on file   Social Determinants of Health   Financial Resource Strain:   . Difficulty of Paying Living Expenses:   Food Insecurity:   . Worried About 07/09/2018 in the Last Year:   . Programme researcher, broadcasting/film/video in the Last Year:   Transportation Needs:   . Barista (Medical):   Freight forwarder Lack of Transportation (Non-Medical):   Physical Activity:   . Days of Exercise per Week:   . Minutes of Exercise per Session:   Stress:   . Feeling of Stress :   Social Connections:   . Frequency of Communication with Friends and Family:   . Frequency of Social Gatherings with Friends and Family:   . Attends Religious Services:   . Active Member of  Clubs or Organizations:   . Attends Banker Meetings:   Marland Kitchen Marital Status:   Intimate Partner Violence:   . Fear of Current or Ex-Partner:   . Emotionally Abused:   Marland Kitchen Physically Abused:   . Sexually Abused:      Current Outpatient Medications:  .  albuterol (VENTOLIN HFA) 108 (90 Base) MCG/ACT inhaler, Inhale 2 puffs into the lungs every 6 (six) hours as needed for wheezing or shortness of breath., Disp: 18 g, Rfl: 2 .  atovaquone-proguanil (MALARONE) 250-100 MG TABS tablet, Take 1 tablet by mouth daily., Disp: 90 tablet, Rfl: 3 .  cetirizine (ZYRTEC) 10 MG tablet, Take 10 mg by mouth daily., Disp: , Rfl:  .  diclofenac sodium (VOLTAREN) 1 % GEL, Apply 4 g  topically 4 (four) times daily. To affected joint., Disp: 100 g, Rfl: 11 .  fluticasone (FLONASE) 50 MCG/ACT nasal spray, Place 2 sprays into both nostrils daily., Disp: 16 g, Rfl: 6 .  ipratropium-albuterol (DUONEB) 0.5-2.5 (3) MG/3ML SOLN, Inhale 3 mLs into the lungs every 6 (six) hours as needed., Disp: 360 mL, Rfl: 0 .  omeprazole (PRILOSEC) 40 MG capsule, Take 1 capsule (40 mg total) by mouth daily., Disp: 30 capsule, Rfl: 3 .  triamcinolone cream (KENALOG) 0.5 %, Apply 1 application topically 2 (two) times daily. To affected areas., Disp: 30 g, Rfl: 3 .  amphetamine-dextroamphetamine (ADDERALL) 10 MG tablet, Take 1 tablet (10 mg total) by mouth 3 (three) times daily., Disp: 90 tablet, Rfl: 0 .  Fluticasone-Salmeterol (WIXELA INHUB) 500-50 MCG/DOSE AEPB, Inhale 1 puff into the lungs in the morning and at bedtime., Disp: 60 each, Rfl: 6  EXAM:  VITALS per patient if applicable: Wt 211 lb (95.7 kg)   BMI 28.62 kg/m   GENERAL: alert, oriented, appears well and in no acute distress  HEENT: atraumatic, conjunttiva clear, no obvious abnormalities on inspection of external nose and ears  NECK: normal movements of the head and neck  LUNGS: on inspection no signs of respiratory distress, breathing rate appears normal, no obvious gross SOB, gasping or wheezing  CV: no obvious cyanosis  MS: moves all visible extremities without noticeable abnormality  PSYCH/NEURO: pleasant and cooperative, no obvious depression or anxiety, speech and thought processing grossly intact  ASSESSMENT AND PLAN:  Discussed the following assessment and plan:  Asthma Needing albuterol more, will increase wixela to 500/50mg .  Continue albuterol as needed.   ADHD Doing well with current dosing of adderall, will continue.  PDMP reviewed.  Follow up in 3 months.      I discussed the assessment and treatment plan with the patient. The patient was provided an opportunity to ask questions and all were answered.  The patient agreed with the plan and demonstrated an understanding of the instructions.   The patient was advised to call back or seek an in-person evaluation if the symptoms worsen or if the condition fails to improve as anticipated.    Everrett Coombe, DO

## 2020-06-08 NOTE — Assessment & Plan Note (Signed)
Doing well with current dosing of adderall, will continue.  PDMP reviewed.  Follow up in 3 months.

## 2020-08-23 ENCOUNTER — Other Ambulatory Visit: Payer: Self-pay

## 2020-08-23 DIAGNOSIS — J4540 Moderate persistent asthma, uncomplicated: Secondary | ICD-10-CM

## 2020-08-24 MED ORDER — AMPHETAMINE-DEXTROAMPHETAMINE 10 MG PO TABS: 10.0000 mg | ORAL_TABLET | Freq: Three times a day (TID) | ORAL | 0 refills | Status: DC

## 2020-08-31 ENCOUNTER — Other Ambulatory Visit: Payer: Self-pay | Admitting: Family Medicine

## 2021-01-10 ENCOUNTER — Other Ambulatory Visit: Payer: Self-pay | Admitting: Family Medicine

## 2021-01-10 DIAGNOSIS — J4540 Moderate persistent asthma, uncomplicated: Secondary | ICD-10-CM

## 2021-01-18 NOTE — Telephone Encounter (Signed)
Patient needs appt. Please call.

## 2021-01-18 NOTE — Telephone Encounter (Signed)
PT has appt scheduled for 16th at 9:30

## 2021-01-18 NOTE — Telephone Encounter (Signed)
Last written 08/24/2020 #270 with no refills Last appt 06/08/2020 No appt scheduled

## 2021-01-24 ENCOUNTER — Encounter: Payer: Self-pay | Admitting: Family Medicine

## 2021-01-24 ENCOUNTER — Other Ambulatory Visit: Payer: Self-pay

## 2021-01-24 ENCOUNTER — Ambulatory Visit: Payer: Managed Care, Other (non HMO) | Admitting: Family Medicine

## 2021-01-24 DIAGNOSIS — F902 Attention-deficit hyperactivity disorder, combined type: Secondary | ICD-10-CM | POA: Diagnosis not present

## 2021-01-24 DIAGNOSIS — J4540 Moderate persistent asthma, uncomplicated: Secondary | ICD-10-CM

## 2021-01-24 MED ORDER — BREO ELLIPTA 200-25 MCG/INH IN AEPB: 1.0000 | INHALATION_SPRAY | Freq: Every day | RESPIRATORY_TRACT | 12 refills | Status: DC

## 2021-01-24 MED ORDER — AMPHETAMINE-DEXTROAMPHETAMINE 10 MG PO TABS: 10.0000 mg | ORAL_TABLET | Freq: Three times a day (TID) | ORAL | 0 refills | Status: DC

## 2021-01-24 MED ORDER — ALBUTEROL SULFATE HFA 108 (90 BASE) MCG/ACT IN AERS: INHALATION_SPRAY | RESPIRATORY_TRACT | 2 refills | Status: DC

## 2021-01-24 NOTE — Assessment & Plan Note (Addendum)
He is more compliant with Breo but cost was an issue previously.  Will send in new rx and see if cost has changed for him.   Albuterol renewed.

## 2021-01-24 NOTE — Progress Notes (Signed)
Joel Best - 50 y.o. male MRN [id number]  Date of birth: 1971-10-04  Subjective Chief Complaint  Patient presents with  . Medication Refill    HPI Joel Best is a 50 y.o. male following up today for asthma and ADD.   Reports that he is doing well at this time.  Current dosing of adderall continues to work well for him.  No side effects including palpitations, anxiety or insomnia.  Appetite remains good.   Reports asthma is doing well.  He did not care for the wixela.  Started using older breo again and then changed to advair once this ran out.  These seem to work better for him.  Easier for him to use breo due to once per day dosing.   ROS:  A comprehensive ROS was completed and negative except as noted per HPI  No Known Allergies  Past Medical History:  Diagnosis Date  . ADHD 07/07/2018  . Asthma     No past surgical history on file.  Social History   Socioeconomic History  . Marital status: Married    Spouse name: Not on file  . Number of children: Not on file  . Years of education: Not on file  . Highest education level: Not on file  Occupational History  . Not on file  Tobacco Use  . Smoking status: Never Smoker  . Smokeless tobacco: Never Used  Substance and Sexual Activity  . Alcohol use: Not on file  . Drug use: Not on file  . Sexual activity: Not on file  Other Topics Concern  . Not on file  Social History Narrative  . Not on file   Social Determinants of Health   Financial Resource Strain: Not on file  Food Insecurity: Not on file  Transportation Needs: Not on file  Physical Activity: Not on file  Stress: Not on file  Social Connections: Not on file    Family History  Problem Relation Age of Onset  . Diabetes Maternal Grandmother   . Hypertension Maternal Grandmother     Health Maintenance  Topic Date Due  . Hepatitis C Screening  Never done  . COLONOSCOPY (Pts 45-28yrs Insurance coverage will need to be confirmed)  Never done  . INFLUENZA  VACCINE  03/08/2021 (Originally 07/09/2020)  . TETANUS/TDAP  07/07/2028  . HIV Screening  Completed     ----------------------------------------------------------------------------------------------------------------------------------------------------------------------------------------------------------------- Physical Exam BP (!) 117/92 (BP Location: Left Arm, Patient Position: Sitting, Cuff Size: Large)   Pulse 76   Wt 230 lb 3.2 oz (104.4 kg)   SpO2 100%   BMI 31.22 kg/m   Physical Exam Constitutional:      Appearance: Normal appearance.  Eyes:     General: No scleral icterus. Cardiovascular:     Rate and Rhythm: Normal rate and regular rhythm.  Pulmonary:     Effort: Pulmonary effort is normal.     Breath sounds: Normal breath sounds.  Musculoskeletal:     Cervical back: Neck supple.  Neurological:     General: No focal deficit present.     Mental Status: He is alert.  Psychiatric:        Mood and Affect: Mood normal.        Behavior: Behavior normal.     ------------------------------------------------------------------------------------------------------------------------------------------------------------------------------------------------------------------- Assessment and Plan  ADHD Doing well with current dosing of adderall, will continue.  PDMP reviewed.  Follow up in 6 months.   Asthma He is more compliant with Breo but cost was an issue previously.  Will send  in new rx and see if cost has changed for him.   Albuterol renewed.    Meds ordered this encounter  Medications  . amphetamine-dextroamphetamine (ADDERALL) 10 MG tablet    Sig: Take 1 tablet (10 mg total) by mouth 3 (three) times daily.    Dispense:  270 tablet    Refill:  0  . albuterol (VENTOLIN HFA) 108 (90 Base) MCG/ACT inhaler    Sig: INHALE 2 PUFFS INTO THE LUNGS EVERY 6 HOURS AS NEEDED FOR WHEEZING OR SHORTNESS OF BREATH    Dispense:  18 g    Refill:  2  . fluticasone  furoate-vilanterol (BREO ELLIPTA) 200-25 MCG/INH AEPB    Sig: Inhale 1 puff into the lungs daily.    Dispense:  1 each    Refill:  12    Return in about 6 months (around 07/24/2021) for ADD.    This visit occurred during the SARS-CoV-2 public health emergency.  Safety protocols were in place, including screening questions prior to the visit, additional usage of staff PPE, and extensive cleaning of exam room while observing appropriate contact time as indicated for disinfecting solutions.

## 2021-01-24 NOTE — Assessment & Plan Note (Signed)
Doing well with current dosing of adderall, will continue.  PDMP reviewed.  Follow up in 6 months.

## 2021-01-24 NOTE — Patient Instructions (Signed)
Continue current medications. See me again in 3 months.  

## 2021-01-29 ENCOUNTER — Other Ambulatory Visit: Payer: Self-pay | Admitting: Family Medicine

## 2021-04-02 ENCOUNTER — Encounter: Payer: Self-pay | Admitting: Family Medicine

## 2021-04-02 ENCOUNTER — Ambulatory Visit (INDEPENDENT_AMBULATORY_CARE_PROVIDER_SITE_OTHER): Payer: 59 | Admitting: Family Medicine

## 2021-04-02 ENCOUNTER — Other Ambulatory Visit: Payer: Self-pay

## 2021-04-02 DIAGNOSIS — J45909 Unspecified asthma, uncomplicated: Secondary | ICD-10-CM

## 2021-04-02 DIAGNOSIS — K649 Unspecified hemorrhoids: Secondary | ICD-10-CM | POA: Diagnosis not present

## 2021-04-02 DIAGNOSIS — M542 Cervicalgia: Secondary | ICD-10-CM

## 2021-04-02 MED ORDER — ALBUTEROL SULFATE HFA 108 (90 BASE) MCG/ACT IN AERS: INHALATION_SPRAY | RESPIRATORY_TRACT | 2 refills | Status: DC

## 2021-04-02 MED ORDER — CYCLOBENZAPRINE HCL 10 MG PO TABS: 10.0000 mg | ORAL_TABLET | Freq: Three times a day (TID) | ORAL | 0 refills | Status: DC | PRN

## 2021-04-02 MED ORDER — MELOXICAM 15 MG PO TABS: 15.0000 mg | ORAL_TABLET | Freq: Every day | ORAL | 0 refills | Status: DC
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
## 2021-04-29 ENCOUNTER — Other Ambulatory Visit: Payer: Self-pay | Admitting: Family Medicine

## 2021-04-29 DIAGNOSIS — M542 Cervicalgia: Secondary | ICD-10-CM

## 2021-05-28 ENCOUNTER — Other Ambulatory Visit: Payer: Self-pay | Admitting: Family Medicine

## 2021-05-28 DIAGNOSIS — M542 Cervicalgia: Secondary | ICD-10-CM

## 2021-06-25 ENCOUNTER — Other Ambulatory Visit: Payer: Self-pay | Admitting: Family Medicine

## 2021-06-25 DIAGNOSIS — M542 Cervicalgia: Secondary | ICD-10-CM

## 2021-08-01 ENCOUNTER — Encounter: Payer: Self-pay | Admitting: Family Medicine

## 2021-09-04 ENCOUNTER — Encounter: Payer: Self-pay | Admitting: Family Medicine

## 2021-10-06 ENCOUNTER — Other Ambulatory Visit: Payer: Self-pay | Admitting: Family Medicine

## 2021-10-06 DIAGNOSIS — M542 Cervicalgia: Secondary | ICD-10-CM

## 2021-10-08 ENCOUNTER — Other Ambulatory Visit: Payer: Self-pay | Admitting: Family Medicine

## 2021-10-08 DIAGNOSIS — J45909 Unspecified asthma, uncomplicated: Secondary | ICD-10-CM

## 2021-10-24 ENCOUNTER — Other Ambulatory Visit: Payer: Self-pay | Admitting: Family Medicine

## 2021-10-24 DIAGNOSIS — J45909 Unspecified asthma, uncomplicated: Secondary | ICD-10-CM

## 2021-11-02 ENCOUNTER — Other Ambulatory Visit: Payer: Self-pay | Admitting: Family Medicine

## 2021-11-02 DIAGNOSIS — J45909 Unspecified asthma, uncomplicated: Secondary | ICD-10-CM

## 2022-03-05 ENCOUNTER — Encounter: Payer: Self-pay | Admitting: Family Medicine

## 2023-01-08 ENCOUNTER — Ambulatory Visit: Payer: 59 | Admitting: Family Medicine

## 2023-01-08 ENCOUNTER — Encounter: Payer: Self-pay | Admitting: Family Medicine

## 2023-01-08 VITALS — BP 125/82 | HR 85 | Ht 72.0 in | Wt 243.0 lb

## 2023-01-08 DIAGNOSIS — Z1211 Encounter for screening for malignant neoplasm of colon: Secondary | ICD-10-CM | POA: Diagnosis not present

## 2023-01-08 DIAGNOSIS — F902 Attention-deficit hyperactivity disorder, combined type: Secondary | ICD-10-CM | POA: Diagnosis not present

## 2023-01-08 DIAGNOSIS — J45909 Unspecified asthma, uncomplicated: Secondary | ICD-10-CM | POA: Diagnosis not present

## 2023-01-08 DIAGNOSIS — J4540 Moderate persistent asthma, uncomplicated: Secondary | ICD-10-CM

## 2023-01-08 DIAGNOSIS — Z2989 Encounter for other specified prophylactic measures: Secondary | ICD-10-CM

## 2023-01-08 MED ORDER — ALBUTEROL SULFATE HFA 108 (90 BASE) MCG/ACT IN AERS: 2.0000 | INHALATION_SPRAY | Freq: Four times a day (QID) | RESPIRATORY_TRACT | 6 refills | Status: DC | PRN

## 2023-01-08 MED ORDER — BREO ELLIPTA 200-25 MCG/ACT IN AEPB: 1.0000 | INHALATION_SPRAY | Freq: Every day | RESPIRATORY_TRACT | 3 refills | Status: DC

## 2023-01-08 MED ORDER — AMPHETAMINE-DEXTROAMPHETAMINE 10 MG PO TABS: 10.0000 mg | ORAL_TABLET | Freq: Three times a day (TID) | ORAL | 0 refills | Status: DC

## 2023-01-08 MED ORDER — ATOVAQUONE-PROGUANIL HCL 250-100 MG PO TABS: 1.0000 | ORAL_TABLET | Freq: Every day | ORAL | 2 refills | Status: DC

## 2023-01-08 NOTE — Assessment & Plan Note (Signed)
Continue daily Breo with albuterol as needed.  Stable with current medications at this time.

## 2023-01-08 NOTE — Progress Notes (Signed)
Joel Best - 52 y.o. male MRN [id number]  Date of birth: 1971/06/17  Subjective Chief Complaint  Patient presents with   Medication Refill    HPI Joel Best is a 52 y.o. male here today for follow-up visit.  Reports he is doing well [facility] heart.  He had been off several his medications due to going through a separation last year.  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  He did well with Adderall 10 mg 3 times daily in the past.  No issues with tolerance previously.    Remains on Breo for maintenance of asthma.  Using albuterol as needed.  This continue to work well for him.  Denies any increased shortness of breath or wheezing.  He will be traveling to [place] for business in the next few weeks.  He has had updated vaccines including yellow fever.  He does need prescription for malaria prevention.  ROS:  A comprehensive ROS was completed and negative except as noted per HPI  No Known Allergies  Past Medical History:  Diagnosis Date   ADHD 07/07/2018   Asthma     History reviewed. No pertinent surgical history.  Social History   Socioeconomic History   Marital status: Married    Spouse name: Not on file   Number of children: Not on file   Years of education: Not on file   Highest education level: Not on file  Occupational History   Not on file  Tobacco Use   Smoking status: Never   Smokeless tobacco: Never  Substance and Sexual Activity   Alcohol use: Not on file   Drug use: Not on file   Sexual activity: Not on file  Other Topics Concern   Not on file  Social History Narrative   Not on file   Social Determinants of Health   Financial Resource Strain: Not on file  Food Insecurity: Not on file  Transportation Needs: Not on file  Physical Activity: Not on file  Stress: Not on file  Social Connections: Not on file    Family History  Problem Relation Age of Onset   Diabetes Maternal  Grandmother    Hypertension Maternal Grandmother     Health Maintenance  Topic Date Due   Hepatitis C Screening  Never done   COLONOSCOPY (Pts 45-29yrs Insurance coverage will need to be confirmed)  01/09/2024 (Originally 10/01/2018)   COVID-19 Vaccine (1) 01/25/2024 (Originally 04/01/1972)   INFLUENZA VACCINE  03/08/2024 (Originally 07/09/2022)   DTaP/Tdap/Td (2 - Td or Tdap) 07/07/2028   HIV Screening  Completed   HPV VACCINES  Aged Out     ----------------------------------------------------------------------------------------------------------------------------------------------------------------------------------------------------------------- Physical Exam BP 125/82 (BP Location: Left Arm, Patient Position: Sitting, Cuff Size: Normal)   Pulse 85   Ht 6' (1.829 m)   Wt 243 lb (110.2 kg)   SpO2 96%   BMI 32.96 kg/m   Physical Exam Constitutional:      Appearance: Normal appearance.  HENT:     Head: Normocephalic and atraumatic.  Cardiovascular:     Rate and Rhythm: Normal rate and regular rhythm.  Pulmonary:     Effort: Pulmonary effort is normal.     Breath sounds: Normal breath sounds.  Musculoskeletal:     Cervical back: Neck supple.  Neurological:     Mental Status: He is alert.  Psychiatric:        Mood and Affect: Mood normal.  Behavior: Behavior normal.     ------------------------------------------------------------------------------------------------------------------------------------------------------------------------------------------------------------------- Assessment and Plan  ADHD Adding Adderall back on at previous strength of 10 mg 3 times daily.  Asthma Continue daily Breo with albuterol as needed.  Stable with current medications at this time.  Need for malaria prophylaxis Adding Malarone for malaria prophylaxis for use when traveling to [place].   Meds ordered this encounter  Medications   albuterol (VENTOLIN HFA) 108 (90 Base)  MCG/ACT inhaler    Sig: Inhale 2 puffs into the lungs every 6 (six) hours as needed for wheezing or shortness of breath.    Dispense:  8 g    Refill:  6   amphetamine-dextroamphetamine (ADDERALL) 10 MG tablet    Sig: Take 1 tablet (10 mg total) by mouth 3 (three) times daily.    Dispense:  270 tablet    Refill:  0   BREO ELLIPTA 200-25 MCG/ACT AEPB    Sig: Inhale 1 puff into the lungs daily.    Dispense:  90 each    Refill:  3   atovaquone-proguanil (MALARONE) 250-100 MG TABS tablet    Sig: Take 1 tablet by mouth daily. Start 2 days prior to travel.  Continue for 7 days upon return.    Dispense:  120 tablet    Refill:  2    Return in about 6 months (around 07/09/2023) for ADD.    This visit occurred during the SARS-CoV-2 public health emergency.  Safety protocols were in place, including screening questions prior to the visit, additional usage of staff PPE, and extensive cleaning of exam room while observing appropriate contact time as indicated for disinfecting solutions.

## 2023-01-08 NOTE — Assessment & Plan Note (Signed)
Adding Malarone for malaria prophylaxis for use when traveling to [place].

## 2023-01-08 NOTE — Assessment & Plan Note (Signed)
Adding Adderall back on at previous strength of 10 mg 3 times daily.

## 2023-01-10 ENCOUNTER — Encounter: Payer: Self-pay | Admitting: Family Medicine

## 2023-01-13 MED ORDER — AMPHETAMINE-DEXTROAMPHETAMINE 20 MG PO TABS: 20.0000 mg | ORAL_TABLET | Freq: Three times a day (TID) | ORAL | 0 refills | Status: DC

## 2023-03-14 ENCOUNTER — Other Ambulatory Visit: Payer: Self-pay

## 2023-03-14 DIAGNOSIS — J45909 Unspecified asthma, uncomplicated: Secondary | ICD-10-CM

## 2023-04-29 ENCOUNTER — Encounter: Payer: Self-pay | Admitting: Family Medicine

## 2023-05-23 ENCOUNTER — Other Ambulatory Visit: Payer: Self-pay | Admitting: Family Medicine

## 2023-05-23 MED ORDER — AMPHETAMINE-DEXTROAMPHETAMINE 20 MG PO TABS: 20.0000 mg | ORAL_TABLET | Freq: Three times a day (TID) | ORAL | 0 refills | Status: DC

## 2023-05-28 ENCOUNTER — Other Ambulatory Visit: Payer: Self-pay

## 2023-05-28 MED ORDER — AMPHETAMINE-DEXTROAMPHETAMINE 20 MG PO TABS
20.0000 mg | ORAL_TABLET | Freq: Three times a day (TID) | ORAL | 0 refills | Status: DC
Start: 2023-05-28 — End: 2023-07-04

## 2023-07-04 ENCOUNTER — Other Ambulatory Visit: Payer: Self-pay | Admitting: Family Medicine

## 2023-07-04 NOTE — Telephone Encounter (Signed)
Requesting Adderall 20mg   Patient comment: I just got off the phone with the pharmacy, they have been kicking my prescription for this, come to find out my insurance allows 90 tablets a month but wont fill the 135. Can you guys send in a new script for only 90 of the 20mg  please.   Last written 05/18/2023 Last OV 01/08/2023 Upcoming appt 07/07/2023

## 2023-07-06 MED ORDER — AMPHETAMINE-DEXTROAMPHETAMINE 20 MG PO TABS: 20.0000 mg | ORAL_TABLET | Freq: Three times a day (TID) | ORAL | 0 refills | Status: DC

## 2023-07-07 ENCOUNTER — Ambulatory Visit: Payer: 59 | Admitting: Family Medicine

## 2023-07-07 ENCOUNTER — Encounter: Payer: Self-pay | Admitting: Family Medicine

## 2023-07-07 VITALS — BP 129/79 | HR 64 | Ht 72.0 in | Wt 232.0 lb

## 2023-07-07 DIAGNOSIS — J45909 Unspecified asthma, uncomplicated: Secondary | ICD-10-CM

## 2023-07-07 DIAGNOSIS — F902 Attention-deficit hyperactivity disorder, combined type: Secondary | ICD-10-CM | POA: Diagnosis not present

## 2023-07-07 MED ORDER — ALBUTEROL SULFATE HFA 108 (90 BASE) MCG/ACT IN AERS
2.0000 | INHALATION_SPRAY | Freq: Four times a day (QID) | RESPIRATORY_TRACT | 6 refills | Status: DC | PRN
Start: 2023-07-07 — End: 2023-09-15

## 2023-07-07 MED ORDER — AMPHETAMINE-DEXTROAMPHETAMINE 20 MG PO TABS: 20.0000 mg | ORAL_TABLET | Freq: Three times a day (TID) | ORAL | 0 refills | Status: DC

## 2023-07-07 MED ORDER — FLUTICASONE FUROATE-VILANTEROL 200-25 MCG/ACT IN AEPB: 1.0000 | INHALATION_SPRAY | Freq: Every day | RESPIRATORY_TRACT | 3 refills | Status: DC

## 2023-07-07 MED ORDER — BREO ELLIPTA 200-25 MCG/ACT IN AEPB: 1.0000 | INHALATION_SPRAY | Freq: Every day | RESPIRATORY_TRACT | 3 refills | Status: DC

## 2023-07-07 MED ORDER — ALBUTEROL SULFATE HFA 108 (90 BASE) MCG/ACT IN AERS
2.0000 | INHALATION_SPRAY | Freq: Four times a day (QID) | RESPIRATORY_TRACT | 6 refills | Status: DC | PRN
Start: 2023-07-07 — End: 2023-07-07

## 2023-07-07 NOTE — Progress Notes (Signed)
Joel Best - 52 y.o. male MRN [id number]  Date of birth: 08/22/1971  Subjective Chief Complaint  Patient presents with   ADD    HPI Joel Best is a 52 y.o. male here today for follow up visit.  He continues to do well with Adderall 20 mg 3 times daily.  This is working well for him at current strength.  He has not had any significant side effects from this at current strength.  He does report that his insurance would not pay for 90-day supply.  Needs separate prescription sent in.  Asthma is well-controlled.  He continues on [place] daily.  This is controlling his symptoms quite well.  He denies side effects from this at current strength.  He does not need to use his rescue inhaler very often.    ROS:  A comprehensive ROS was completed and negative except as noted per HPI  No Known Allergies  Past Medical History:  Diagnosis Date   ADHD 07/07/2018   Asthma     History reviewed. No pertinent surgical history.  Social History   Socioeconomic History   Marital status: Married    Spouse name: Not on file   Number of children: Not on file   Years of education: Not on file   Highest education level: Not on file  Occupational History   Not on file  Tobacco Use   Smoking status: Never   Smokeless tobacco: Never  Substance and Sexual Activity   Alcohol use: Not on file   Drug use: Not on file   Sexual activity: Not on file  Other Topics Concern   Not on file  Social History Narrative   Not on file   Social Determinants of Health   Financial Resource Strain: Not on file  Food Insecurity: Not on file  Transportation Needs: Not on file  Physical Activity: Not on file  Stress: Not on file  Social Connections: Not on file    Family History  Problem Relation Age of Onset   Diabetes Maternal Grandmother    Hypertension Maternal Grandmother     Health Maintenance  Topic Date Due   Colonoscopy  01/09/2024 (Originally 10/01/2018)   COVID-19 Vaccine (1 - 2023-24 season)  01/25/2024 (Originally 08/09/2022)   INFLUENZA VACCINE  03/08/2024 (Originally 07/10/2023)   Hepatitis C Screening  07/06/2024 (Originally 10/02/1991)   DTaP/Tdap/Td (2 - Td or Tdap) 07/07/2028   HIV Screening  Completed   HPV VACCINES  Aged Out     ----------------------------------------------------------------------------------------------------------------------------------------------------------------------------------------------------------------- Physical Exam BP 129/79 (BP Location: Left Arm, Patient Position: Sitting, Cuff Size: Large)   Pulse 64   Ht 6' (1.829 m)   Wt 232 lb (105.2 kg)   SpO2 98%   BMI 31.46 kg/m   Physical Exam Constitutional:      Appearance: Normal appearance.  HENT:     Head: Normocephalic and atraumatic.  Cardiovascular:     Rate and Rhythm: Normal rate and regular rhythm.  Pulmonary:     Effort: Pulmonary effort is normal.     Breath sounds: Normal breath sounds.  Musculoskeletal:     Cervical back: Neck supple.  Neurological:     General: No focal deficit present.     Mental Status: He is alert.  Psychiatric:        Mood and Affect: Mood normal.        Behavior: Behavior normal.     ------------------------------------------------------------------------------------------------------------------------------------------------------------------------------------------------------------------- Assessment and Plan  ADHD He is doing with Adderall 20 mg 3 times daily.  Will plan to continue this at current strength.  Will plan to follow-up in 6 months.  Asthma Is doing quite well with Breo.  Will plan to continue this at current strength.  May continue albuterol as needed.   Meds ordered this encounter  Medications   DISCONTD: albuterol (VENTOLIN HFA) 108 (90 Base) MCG/ACT inhaler    Sig: Inhale 2 puffs into the lungs every 6 (six) hours as needed for wheezing or shortness of breath.    Dispense:  8 g    Refill:  6   DISCONTD:  amphetamine-dextroamphetamine (ADDERALL) 20 MG tablet    Sig: Take 1 tablet (20 mg total) by mouth 3 (three) times daily.    Dispense:  90 tablet    Refill:  0    Fill in 60 days   DISCONTD: BREO ELLIPTA 200-25 MCG/ACT AEPB    Sig: Inhale 1 puff into the lungs daily.    Dispense:  90 each    Refill:  3   DISCONTD: amphetamine-dextroamphetamine (ADDERALL) 20 MG tablet    Sig: Take 1 tablet (20 mg total) by mouth in the morning, at noon, and at bedtime. Fill in 30 days    Dispense:  90 tablet    Refill:  0   albuterol (VENTOLIN HFA) 108 (90 Base) MCG/ACT inhaler    Sig: Inhale 2 puffs into the lungs every 6 (six) hours as needed for wheezing or shortness of breath.    Dispense:  8 g    Refill:  6   amphetamine-dextroamphetamine (ADDERALL) 20 MG tablet    Sig: Take 1 tablet (20 mg total) by mouth 3 (three) times daily.    Dispense:  90 tablet    Refill:  0    Fill in 60 days   amphetamine-dextroamphetamine (ADDERALL) 20 MG tablet    Sig: Take 1 tablet (20 mg total) by mouth in the morning, at noon, and at bedtime. Fill in 30 days    Dispense:  90 tablet    Refill:  0   fluticasone furoate-vilanterol (BREO ELLIPTA) 200-25 MCG/ACT AEPB    Sig: Inhale 1 puff into the lungs daily.    Dispense:  90 each    Refill:  3    Return in about 6 months (around 01/07/2024) for F/u ADD.    This visit occurred during the SARS-CoV-2 public health emergency.  Safety protocols were in place, including screening questions prior to the visit, additional usage of staff PPE, and extensive cleaning of exam room while observing appropriate contact time as indicated for disinfecting solutions.

## 2023-07-07 NOTE — Assessment & Plan Note (Signed)
He is doing with Adderall 20 mg 3 times daily.  Will plan to continue this at current strength.  Will plan to follow-up in 6 months.

## 2023-07-07 NOTE — Assessment & Plan Note (Signed)
Is doing quite well with Breo.  Will plan to continue this at current strength.  May continue albuterol as needed.

## 2023-08-09 ENCOUNTER — Other Ambulatory Visit: Payer: Self-pay | Admitting: Family Medicine

## 2023-08-12 ENCOUNTER — Encounter: Payer: Self-pay | Admitting: Family Medicine

## 2023-09-12 ENCOUNTER — Encounter: Payer: Self-pay | Admitting: Family Medicine

## 2023-09-12 MED ORDER — AMPHETAMINE-DEXTROAMPHETAMINE 20 MG PO TABS: 20.0000 mg | ORAL_TABLET | Freq: Three times a day (TID) | ORAL | 0 refills | Status: DC

## 2023-09-15 ENCOUNTER — Other Ambulatory Visit: Payer: Self-pay

## 2023-09-15 DIAGNOSIS — J45909 Unspecified asthma, uncomplicated: Secondary | ICD-10-CM

## 2023-09-15 MED ORDER — ALBUTEROL SULFATE HFA 108 (90 BASE) MCG/ACT IN AERS: 2.0000 | INHALATION_SPRAY | Freq: Four times a day (QID) | RESPIRATORY_TRACT | 6 refills | Status: DC | PRN

## 2023-09-15 MED ORDER — FLUTICASONE FUROATE-VILANTEROL 200-25 MCG/ACT IN AEPB: 1.0000 | INHALATION_SPRAY | Freq: Every day | RESPIRATORY_TRACT | 0 refills | Status: AC

## 2024-01-08 ENCOUNTER — Other Ambulatory Visit: Payer: Self-pay | Admitting: Family Medicine

## 2024-01-09 MED ORDER — AMPHETAMINE-DEXTROAMPHETAMINE 20 MG PO TABS: 20.0000 mg | ORAL_TABLET | Freq: Three times a day (TID) | ORAL | 0 refills | Status: DC

## 2024-08-04 ENCOUNTER — Telehealth: Payer: Self-pay

## 2024-08-04 DIAGNOSIS — J45909 Unspecified asthma, uncomplicated: Secondary | ICD-10-CM

## 2024-08-04 MED ORDER — ALBUTEROL SULFATE HFA 108 (90 BASE) MCG/ACT IN AERS: 2.0000 | INHALATION_SPRAY | Freq: Four times a day (QID) | RESPIRATORY_TRACT | 0 refills | Status: AC | PRN

## 2024-08-04 NOTE — Telephone Encounter (Signed)
 Pls contact pt to schedule appt with Dr. Alvia for medication refills and labs. Last seen 2024. Thx.

## 2024-08-11 ENCOUNTER — Other Ambulatory Visit: Payer: Self-pay

## 2024-08-11 MED ORDER — ATOVAQUONE-PROGUANIL HCL 250-100 MG PO TABS: 1.0000 | ORAL_TABLET | Freq: Every day | ORAL | 3 refills | Status: AC

## 2024-08-18 ENCOUNTER — Ambulatory Visit: Admitting: Family Medicine

## 2024-08-20 ENCOUNTER — Other Ambulatory Visit: Payer: Self-pay

## 2024-08-31 ENCOUNTER — Encounter: Payer: Self-pay | Admitting: Family Medicine

## 2024-08-31 ENCOUNTER — Ambulatory Visit: Admitting: Family Medicine

## 2024-08-31 VITALS — BP 148/96 | HR 103 | Ht 72.0 in | Wt 245.0 lb

## 2024-08-31 DIAGNOSIS — J4540 Moderate persistent asthma, uncomplicated: Secondary | ICD-10-CM | POA: Diagnosis not present

## 2024-08-31 DIAGNOSIS — I10 Essential (primary) hypertension: Secondary | ICD-10-CM | POA: Diagnosis not present

## 2024-08-31 DIAGNOSIS — F902 Attention-deficit hyperactivity disorder, combined type: Secondary | ICD-10-CM | POA: Diagnosis not present

## 2024-08-31 MED ORDER — AMPHETAMINE-DEXTROAMPHETAMINE 20 MG PO TABS: 20.0000 mg | ORAL_TABLET | Freq: Three times a day (TID) | ORAL | 0 refills | Status: AC

## 2024-08-31 MED ORDER — FLUTICASONE FUROATE-VILANTEROL 200-25 MCG/ACT IN AEPB: 1.0000 | INHALATION_SPRAY | Freq: Every day | RESPIRATORY_TRACT | 11 refills | Status: AC

## 2024-08-31 NOTE — Assessment & Plan Note (Signed)
 Blood pressure is elevated.  Recommend low-sodium diet and recheck of blood pressure in 2 to 3 weeks.

## 2024-08-31 NOTE — Assessment & Plan Note (Signed)
He is doing with Adderall 20 mg 3 times daily.  Will plan to continue this at current strength.  Will plan to follow-up in 6 months.

## 2024-08-31 NOTE — Assessment & Plan Note (Signed)
Is doing quite well with Breo.  Will plan to continue this at current strength.  May continue albuterol as needed.

## 2024-08-31 NOTE — Progress Notes (Signed)
 Joel Best - 53 y.o. male MRN [id number]  Date of birth: May 22, 1971  Subjective Chief Complaint  Patient presents with   Medication Refill    Labs    HPI Joel Best is a 53 y.o. male here today for follow up visit.   He reports that he is doing pretty well at this time.  20 mg 3 times daily remains effective at current strength.  He denies side effects at this time.  Reports he is sleeping well denies any significant appetite change.  Blood pressure little elevated today.  Denies symptoms including chest pain, shortness of breath, palpitations, headaches or vision changes.  Asthma stable.  Using Breo daily with albuterol  as needed.  No recent DuoNeb use.  GERD stable with Prilosec at current strength.  ROS:  A comprehensive ROS was completed and negative except as noted per HPI    Not on File  Past Medical History:  Diagnosis Date   ADHD 07/07/2018   Asthma     No past surgical history on file.  Social History   Socioeconomic History   Marital status: Married    Spouse name: Not on file   Number of children: Not on file   Years of education: Not on file   Highest education level: Not on file  Occupational History   Not on file  Tobacco Use   Smoking status: Never   Smokeless tobacco: Never  Substance and Sexual Activity   Alcohol use: Not on file   Drug use: Not on file   Sexual activity: Not on file  Other Topics Concern   Not on file  Social History Narrative   Not on file   Social Drivers of Health   Financial Resource Strain: Not on file  Food Insecurity: Not on file  Transportation Needs: Not on file  Physical Activity: Not on file  Stress: Not on file  Social Connections: Not on file    Family History  Problem Relation Age of Onset   Diabetes Maternal Grandmother    Hypertension Maternal Grandmother     Health Maintenance  Topic Date Due   Hepatitis C Screening  Never done   Pneumococcal Vaccine: 50+ Years (1 of 2 - PCV) Never done    Hepatitis B Vaccines 19-59 Average Risk (1 of 3 - 19+ 3-dose series) Never done   Colonoscopy  Never done   Zoster Vaccines- Shingrix (1 of 2) Never done   Influenza Vaccine  Never done   COVID-19 Vaccine (1 - 2024-25 season) Never done   DTaP/Tdap/Td (2 - Td or Tdap) 07/07/2028   HIV Screening  Completed   HPV VACCINES  Aged Out   Meningococcal B Vaccine  Aged Out     ----------------------------------------------------------------------------------------------------------------------------------------------------------------------------------------------------------------- Physical Exam BP (!) 148/96   Pulse (!) 103   Ht 6' (1.829 m)   Wt 245 lb (111.1 kg)   SpO2 98%   BMI 33.23 kg/m   Physical Exam Constitutional:      Appearance: Normal appearance.  Eyes:     General: No scleral icterus. Cardiovascular:     Rate and Rhythm: Normal rate and regular rhythm.  Pulmonary:     Effort: Pulmonary effort is normal.     Breath sounds: Normal breath sounds.  Musculoskeletal:     Cervical back: Neck supple.  Neurological:     General: No focal deficit present.     Mental Status: He is alert.  Psychiatric:        Mood and Affect: Mood  normal.        Behavior: Behavior normal.     ------------------------------------------------------------------------------------------------------------------------------------------------------------------------------------------------------------------- Assessment and Plan  Asthma Is doing quite well with Breo.  Will plan to continue this at current strength.  May continue albuterol  as needed.  HTN (hypertension) Blood pressure is elevated.  Recommend low-sodium diet and recheck of blood pressure in 2 to 3 weeks.  ADHD He is doing with Adderall 20 mg 3 times daily.  Will plan to continue this at current strength.  Will plan to follow-up in 6 months.   Meds ordered this encounter  Medications   fluticasone  furoate-vilanterol (BREO  ELLIPTA) 200-25 MCG/ACT AEPB    Sig: Inhale 1 puff into the lungs daily.    Dispense:  1 each    Refill:  11   amphetamine -dextroamphetamine  (ADDERALL) 20 MG tablet    Sig: Take 1 tablet (20 mg total) by mouth in the morning, at noon, and at bedtime.    Dispense:  90 tablet    Refill:  0   amphetamine -dextroamphetamine  (ADDERALL) 20 MG tablet    Sig: Take 1 tablet (20 mg total) by mouth 3 (three) times daily.    Dispense:  90 tablet    Refill:  0    Fill in 30 days.    Return in about 6 months (around 02/28/2025) for ADHD.

## 2024-10-06 ENCOUNTER — Encounter: Payer: Self-pay | Admitting: Family Medicine

## 2024-10-07 NOTE — Telephone Encounter (Signed)
 SABRA

## 2025-03-01 ENCOUNTER — Ambulatory Visit: Admitting: Family Medicine
